# Patient Record
Sex: Female | Born: 1990
Health system: Southern US, Community
[De-identification: ages and names within clinical notes are randomized; demographics above are authoritative.]

## PROBLEM LIST (undated history)

## (undated) ENCOUNTER — Ambulatory Visit: Admission: EM | Payer: Self-pay

## (undated) ENCOUNTER — Inpatient Hospital Stay (HOSPITAL_COMMUNITY): Payer: Self-pay

## (undated) DIAGNOSIS — D649 Anemia, unspecified: Secondary | ICD-10-CM

## (undated) DIAGNOSIS — N939 Abnormal uterine and vaginal bleeding, unspecified: Secondary | ICD-10-CM

## (undated) DIAGNOSIS — E282 Polycystic ovarian syndrome: Secondary | ICD-10-CM

## (undated) DIAGNOSIS — D219 Benign neoplasm of connective and other soft tissue, unspecified: Secondary | ICD-10-CM

## (undated) HISTORY — DX: Polycystic ovarian syndrome: E28.2

---

## 2017-02-24 ENCOUNTER — Ambulatory Visit (HOSPITAL_COMMUNITY)
Admission: EM | Admit: 2017-02-24 | Discharge: 2017-02-24 | Disposition: A | Discharge status: Home / Self Care | Payer: Self-pay | Attending: Family Medicine | Admitting: Family Medicine

## 2017-02-24 ENCOUNTER — Encounter (HOSPITAL_COMMUNITY): Payer: Self-pay | Admitting: Emergency Medicine

## 2017-02-24 DIAGNOSIS — N946 Dysmenorrhea, unspecified: Secondary | ICD-10-CM

## 2017-02-24 DIAGNOSIS — N939 Abnormal uterine and vaginal bleeding, unspecified: Secondary | ICD-10-CM

## 2017-02-24 HISTORY — DX: Abnormal uterine and vaginal bleeding, unspecified: N93.9

## 2017-02-24 MED ORDER — KETOROLAC TROMETHAMINE 60 MG/2ML IM SOLN
INTRAMUSCULAR | Status: AC
  Filled 2017-02-24: qty 2

## 2017-02-24 MED ORDER — KETOROLAC TROMETHAMINE 60 MG/2ML IM SOLN
45.0000 mg | Freq: Once | INTRAMUSCULAR | Status: AC
  Administered 2017-02-24: 45 mg via INTRAMUSCULAR

## 2017-02-24 MED ORDER — NORGESTIM-ETH ESTRAD TRIPHASIC 0.18/0.215/0.25 MG-25 MCG PO TABS: 1.0000 | ORAL_TABLET | Freq: Every day | ORAL | 1 refills | Status: DC

## 2017-02-24 NOTE — ED Triage Notes (Signed)
Pt sts severe menstrual cramps today; pt sts bleeding x 2 days with hx of abnormal uterine bleeding

## 2017-02-24 NOTE — Discharge Instructions (Signed)
Take the meds as directed. Follow-up with the gynecologist as soon as possible.

## 2017-02-24 NOTE — ED Provider Notes (Signed)
Iatan    CSN: [id number] Arrival date & time: 02/24/17  1710     History   Chief Complaint Chief Complaint  Patient presents with  . Abdominal Cramping    HPI Lori Gillespie is a 26 y.o. female.   26 year old female complaining of dysmenorrhea. She has had some minor menstrual cramping previously that resolved with OTC NSAIDs but since chest today the menstrual cramping is been moderate to severe. Her menses started 2 days ago. She states as a little heavy. She has a history of a you be and irregular periods. She states a out of state physician gave her some pills as she recently started taking but she is unsure as to what that he has she is trying to call and find out. She does not have a PCP. Denies GI symptoms.  The medication prescribed by the previous physician. To be norgestimate and ethyl estradiol likely a [place]. This week she started the placebo pack, bus her menses and menstrual cramps returned.      Past Medical History:  Diagnosis Date  . Abnormal uterine bleeding     There are no active problems to display for this patient.   History reviewed. No pertinent surgical history.  OB History    No data available       Home Medications    Prior to Admission medications   Medication Sig Start Date End Date Taking? Authorizing Provider  Norgestimate-Ethinyl Estradiol Triphasic (ORTHO TRI-CYCLEN LO) 0.18/0.215/0.25 MG-25 MCG tab Take 1 tablet by mouth daily. 02/24/17   Janne Napoleon, NP    Family History History reviewed. No pertinent family history.  Social History Social History  Substance Use Topics  . Smoking status: Never Smoker  . Smokeless tobacco: Never Used  . Alcohol use No     Allergies   Patient has no known allergies.   Review of Systems Review of Systems  Constitutional: Negative.   Respiratory: Negative.   Gastrointestinal: Negative.   Genitourinary: Positive for menstrual problem. Negative for dysuria,  flank pain, frequency, genital sores and vaginal discharge.  Musculoskeletal: Negative.   Neurological: Negative.   All other systems reviewed and are negative.    Physical Exam Triage Vital Signs ED Triage Vitals [02/24/17 1727]  Enc Vitals Group     BP 138/84     Pulse Rate (!) 102     Resp 18     Temp 99.5 F (37.5 C)     Temp Source Oral     SpO2 100 %     Weight      Height      Head Circumference      Peak Flow      Pain Score 10     Pain Loc      Pain Edu?      Excl. in [place]?    No data found.   Updated Vital Signs BP 138/84 (BP Location: Right Arm)   Pulse (!) 102   Temp 99.5 F (37.5 C) (Oral)   Resp 18   SpO2 100%   Visual Acuity Right Eye Distance:   Left Eye Distance:   Bilateral Distance:    Right Eye Near:   Left Eye Near:    Bilateral Near:     Physical Exam  Constitutional: She is oriented to person, place, and time. She appears well-developed and well-nourished. No distress.  HENT:  Head: Normocephalic and atraumatic.  Eyes: EOM are normal.  Neck: Normal range of motion. Neck  supple.  Cardiovascular: Normal rate.   Pulmonary/Chest: Effort normal. No respiratory distress.  Musculoskeletal: She exhibits no edema.  Neurological: She is alert and oriented to person, place, and time. She exhibits normal muscle tone.  Skin: Skin is warm and dry.  Psychiatric: She has a normal mood and affect.  Nursing note and vitals reviewed.    UC Treatments / Results  Labs (all labs ordered are listed, but only abnormal results are displayed) Labs Reviewed - No data to display  EKG  EKG Interpretation None       Radiology No results found.  Procedures Procedures (including critical care time)  Medications Ordered in UC Medications  ketorolac (TORADOL) injection 45 mg (not administered)     Initial Impression / Assessment and Plan / UC Course  I have reviewed the triage vital signs and the nursing notes.  Pertinent labs & imaging  results that were available during my care of the patient were reviewed by me and considered in my medical decision making (see chart for details).    Take the meds as directed. Follow-up with the gynecologist as soon as possible.     Final Clinical Impressions(s) / UC Diagnoses   Final diagnoses:  Dysmenorrhea  Abnormal uterine bleeding (AUB)    New Prescriptions New Prescriptions   NORGESTIMATE-ETHINYL ESTRADIOL TRIPHASIC (ORTHO TRI-CYCLEN LO) 0.18/0.215/0.25 MG-25 MCG TAB    Take 1 tablet by mouth daily.     Controlled Substance Prescriptions [place] Controlled Substance Registry consulted? Not Applicable   Janne Napoleon, NP 02/24/17 1806

## 2017-03-05 ENCOUNTER — Encounter (HOSPITAL_BASED_OUTPATIENT_CLINIC_OR_DEPARTMENT_OTHER): Payer: Self-pay

## 2017-03-05 ENCOUNTER — Emergency Department (HOSPITAL_BASED_OUTPATIENT_CLINIC_OR_DEPARTMENT_OTHER): Payer: Self-pay

## 2017-03-05 ENCOUNTER — Inpatient Hospital Stay (HOSPITAL_BASED_OUTPATIENT_CLINIC_OR_DEPARTMENT_OTHER)
Admission: AD | Admit: 2017-03-05 | Discharge: 2017-03-06 | DRG: 812 | Disposition: A | Discharge status: Home / Self Care | Payer: Self-pay | Attending: Obstetrics & Gynecology | Admitting: Obstetrics & Gynecology

## 2017-03-05 ENCOUNTER — Inpatient Hospital Stay (HOSPITAL_COMMUNITY): Payer: Self-pay

## 2017-03-05 DIAGNOSIS — N921 Excessive and frequent menstruation with irregular cycle: Secondary | ICD-10-CM | POA: Diagnosis present

## 2017-03-05 DIAGNOSIS — D649 Anemia, unspecified: Secondary | ICD-10-CM

## 2017-03-05 DIAGNOSIS — D259 Leiomyoma of uterus, unspecified: Secondary | ICD-10-CM | POA: Diagnosis present

## 2017-03-05 DIAGNOSIS — Z8742 Personal history of other diseases of the female genital tract: Secondary | ICD-10-CM

## 2017-03-05 DIAGNOSIS — N939 Abnormal uterine and vaginal bleeding, unspecified: Secondary | ICD-10-CM | POA: Diagnosis present

## 2017-03-05 DIAGNOSIS — D500 Iron deficiency anemia secondary to blood loss (chronic): Principal | ICD-10-CM | POA: Diagnosis present

## 2017-03-05 HISTORY — DX: Anemia, unspecified: D64.9

## 2017-03-05 HISTORY — DX: Benign neoplasm of connective and other soft tissue, unspecified: D21.9

## 2017-03-05 LAB — CBC WITH DIFFERENTIAL/PLATELET
BASOS PCT: 0 %
Basophils Absolute: 0 10*3/uL (ref 0.0–0.1)
EOS PCT: 2 %
Eosinophils Absolute: 0.3 10*3/uL (ref 0.0–0.7)
HEMATOCRIT: 17.6 % — AB (ref 36.0–46.0)
HEMOGLOBIN: 4.9 g/dL — AB (ref 12.0–15.0)
LYMPHS PCT: 17 %
Lymphs Abs: 2.2 10*3/uL (ref 0.7–4.0)
MCH: 22.4 pg — AB (ref 26.0–34.0)
MCHC: 27.8 g/dL — ABNORMAL LOW (ref 30.0–36.0)
MCV: 80.4 fL (ref 78.0–100.0)
MONOS PCT: 8 %
Monocytes Absolute: 1 10*3/uL (ref 0.1–1.0)
NEUTROS ABS: 9.6 10*3/uL — AB (ref 1.7–7.7)
NEUTROS PCT: 73 %
PLATELETS: 450 10*3/uL — AB (ref 150–400)
RBC: 2.19 MIL/uL — ABNORMAL LOW (ref 3.87–5.11)
RDW: 23.8 % — ABNORMAL HIGH (ref 11.5–15.5)
WBC: 13.1 10*3/uL — ABNORMAL HIGH (ref 4.0–10.5)

## 2017-03-05 LAB — HCG, QUANTITATIVE, PREGNANCY

## 2017-03-05 LAB — COMPREHENSIVE METABOLIC PANEL
ALBUMIN: 3.3 g/dL — AB (ref 3.5–5.0)
ALK PHOS: 52 U/L (ref 38–126)
ALT: 17 U/L (ref 14–54)
ANION GAP: 8 (ref 5–15)
AST: 22 U/L (ref 15–41)
BILIRUBIN TOTAL: 0.3 mg/dL (ref 0.3–1.2)
BUN: 11 mg/dL (ref 6–20)
CO2: 22 mmol/L (ref 22–32)
Calcium: 8.4 mg/dL — ABNORMAL LOW (ref 8.9–10.3)
Chloride: 107 mmol/L (ref 101–111)
Creatinine, Ser: 0.57 mg/dL (ref 0.44–1.00)
GFR calc Af Amer: >60 mL/min (ref >60)
GFR calc non Af Amer: >60 mL/min (ref >60)
GLUCOSE: 112 mg/dL — AB (ref 65–99)
POTASSIUM: 3.4 mmol/L — AB (ref 3.5–5.1)
SODIUM: 137 mmol/L (ref 135–145)
Total Protein: 6.4 g/dL — ABNORMAL LOW (ref 6.5–8.1)

## 2017-03-05 LAB — URINALYSIS, ROUTINE W REFLEX MICROSCOPIC
BILIRUBIN URINE: NEGATIVE
Glucose, UA: NEGATIVE mg/dL
Ketones, ur: NEGATIVE mg/dL
NITRITE: NEGATIVE
Protein, ur: NEGATIVE mg/dL
SPECIFIC GRAVITY, URINE: 1.01 (ref 1.005–1.030)
pH: 6 (ref 5.0–8.0)

## 2017-03-05 LAB — PROTIME-INR
INR: 0.95
Prothrombin Time: 12.6 seconds (ref 11.4–15.2)

## 2017-03-05 LAB — TROPONIN I: Troponin I: <0.03 ng/mL (ref <0.03)

## 2017-03-05 LAB — URINALYSIS, MICROSCOPIC (REFLEX)

## 2017-03-05 LAB — PREPARE RBC (CROSSMATCH)

## 2017-03-05 LAB — ABO/RH: ABO/RH(D): O POS

## 2017-03-05 MED ORDER — ESTROGENS CONJUGATED 25 MG IJ SOLR
25.0000 mg | Freq: Once | INTRAMUSCULAR | Status: DC
  Filled 2017-03-05: qty 25

## 2017-03-05 MED ORDER — SODIUM CHLORIDE 0.9 % IV SOLN
Freq: Once | INTRAVENOUS | Status: AC
  Administered 2017-03-05: 13:00:00 via INTRAVENOUS

## 2017-03-05 MED ORDER — SODIUM CHLORIDE 0.9 % IV SOLN: Freq: Once | INTRAVENOUS | Status: DC

## 2017-03-05 MED ORDER — DIPHENHYDRAMINE HCL 25 MG PO CAPS
25.0000 mg | ORAL_CAPSULE | Freq: Once | ORAL | Status: AC
  Administered 2017-03-05: 25 mg via ORAL
  Filled 2017-03-05: qty 1

## 2017-03-05 MED ORDER — ACETAMINOPHEN 325 MG PO TABS
650.0000 mg | ORAL_TABLET | Freq: Once | ORAL | Status: AC
  Administered 2017-03-05: 650 mg via ORAL
  Filled 2017-03-05: qty 2

## 2017-03-05 MED ORDER — PENTAFLUOROPROP-TETRAFLUOROETH EX AERO
INHALATION_SPRAY | CUTANEOUS | Status: DC | PRN
  Filled 2017-03-05: qty 30

## 2017-03-05 MED ORDER — PRENATAL MULTIVITAMIN CH
1.0000 | ORAL_TABLET | Freq: Every day | ORAL | Status: DC
  Filled 2017-03-05: qty 1

## 2017-03-05 MED ORDER — PENTAFLUOROPROP-TETRAFLUOROETH EX AERO
INHALATION_SPRAY | CUTANEOUS | Status: AC
  Filled 2017-03-05: qty 30

## 2017-03-05 NOTE — ED Triage Notes (Signed)
C/o CP x 1 week-pt states she had recent heavy vaginal bleeding-started meds x 1 month ago-pt is pale-DOE noted

## 2017-03-05 NOTE — ED Notes (Signed)
ED Provider at bedside. 

## 2017-03-05 NOTE — ED Notes (Signed)
Lab draw attempted unsuccessful, another RN to attempt when pt returns from xray.

## 2017-03-05 NOTE — MAU Provider Note (Signed)
History  I confirm that I have verified the information documented in the medical student's note and that I have also personally reperformed the physical exam and all medical decision making activities. Laury Gillespie, CNM  03/05/2017 8:43 PM     CSN: [id number]  Arrival date & time 03/05/17  1119   First Provider Initiated Contact with Patient 03/05/17 1629      Chief Complaint  Patient presents with  . Chest Pain  . Vaginal Bleeding    Pt is a 26 yo female with a PMH of Abnormal Uterine Bleeding due to fibroids who presents with vaginal bleeding. Pt was transported from ED in [facility] due to low Hemoglobin and hematocrit. Pt presented to the ED with spotting, chest palpitations, SOB and dizziness that started a week ago. Pt reports that she was diagnosed with Aub while she lived in [place]. She had one episode of bleeding that required a prescription for ferrous sulfate. Seven months ago, she move to [facility] and started exercising more. She started having episodes of heavy bleeding about 6 wks ago so she saw a doctor in [facility] who prescribed her a combination OCP (Norgestimate and Ethinyl Estradiol) to regular her cycle. During the last wk of her cycle, she started to bleeding heavily with severe abd cramps. She was again by her doctor and started on a new combined OCP at the end of Oct. Since then, she has had intermittent spotting described as bright red with clots. [place] then went to [place] because of the associated symptoms such as the chest palpitations, SOB and dizziness.     Past Medical History:  Diagnosis Date  . Abnormal uterine bleeding   . Abnormal uterine bleeding   . Anemia   . Fibroids     History reviewed. No pertinent surgical history.  No family history on file.  Social History   Tobacco Use  . Smoking status: Never Smoker  . Smokeless tobacco: Never Used  Substance Use Topics  . Alcohol use: No  . Drug use: No    OB History    No data  available      Review of Systems  Constitutional: Positive for activity change and fatigue. Negative for appetite change and fever.  HENT: Negative for congestion.   Eyes: Negative for visual disturbance.  Respiratory: Positive for shortness of breath. Negative for cough, chest tightness and wheezing.   Cardiovascular: Positive for palpitations. Negative for chest pain and leg swelling.  Gastrointestinal: Negative for abdominal pain and nausea.  Genitourinary: Positive for menstrual problem and vaginal bleeding. Negative for difficulty urinating, flank pain, hematuria and vaginal pain.  Musculoskeletal: Negative for back pain and myalgias.  Skin: Negative for color change and rash.  Neurological: Positive for dizziness. Negative for syncope and headaches.  Psychiatric/Behavioral: Negative for agitation and confusion.    Allergies  Patient has no known allergies.  Home Medications    BP 123/62   Pulse 89   Temp 98 F (36.7 C) (Oral)   Resp 16   Ht 5\' 1"  (1.549 m)   Wt 90.8 kg (200 lb 2.8 oz)   LMP 02/19/2017   SpO2 100%   BMI 37.82 kg/m   Physical Exam  Constitutional: She is oriented to person, place, and time. She appears well-developed and well-nourished. No distress.  HENT:  Head: Normocephalic and atraumatic.  Eyes: Conjunctivae are normal. No scleral icterus.  Neck: Normal range of motion. Neck supple.  Cardiovascular: Normal rate, regular  rhythm, normal heart sounds and intact distal pulses. Exam reveals no gallop and no friction rub.  No murmur heard. Pulmonary/Chest: Effort normal and breath sounds normal. No respiratory distress. She has no wheezes. She exhibits no tenderness.  Abdominal: Soft. Bowel sounds are normal. She exhibits no distension. There is no tenderness.  Musculoskeletal: Normal range of motion.  Neurological: She is alert and oriented to person, place, and time.  Skin: Skin is warm and dry. No rash noted.  Psychiatric: She has a normal mood  and affect.    MAU Course  Procedures (including critical care time) Results for orders placed or performed during the hospital encounter of 03/05/17 (from the past 24 hour(s))  Type and screen [place]     Status: None (Preliminary result)   Collection Time: 03/05/17 12:05 PM  Result Value Ref Range   ABO/RH(D) O POS    Antibody Screen PENDING    Sample Expiration 03/08/2017    Unit Number [id number]    Blood Component Type RED CELLS,LR    Unit division 00    Status of Unit ISSUED    Unit tag comment VERBAL ORDERS PER DR PFEIFFER    Transfusion Status OK TO TRANSFUSE    Crossmatch Result PENDING    Unit Number [id number]    Blood Component Type RED CELLS,LR    Unit division 00    Status of Unit ISSUED    Unit tag comment VERBAL ORDERS PER DR PFEIFFER    Transfusion Status OK TO TRANSFUSE    Crossmatch Result PENDING   Comprehensive metabolic panel     Status: Abnormal   Collection Time: 03/05/17 12:39 PM  Result Value Ref Range   Sodium 137 135 - 145 mmol/L   Potassium 3.4 (L) 3.5 - 5.1 mmol/L   Chloride 107 101 - 111 mmol/L   CO2 22 22 - 32 mmol/L   Glucose, Bld 112 (H) 65 - 99 mg/dL   BUN 11 6 - 20 mg/dL   Creatinine, Ser 0.57 0.44 - 1.00 mg/dL   Calcium 8.4 (L) 8.9 - 10.3 mg/dL   Total Protein 6.4 (L) 6.5 - 8.1 g/dL   Albumin 3.3 (L) 3.5 - 5.0 g/dL   AST 22 15 - 41 U/L   ALT 17 14 - 54 U/L   Alkaline Phosphatase 52 38 - 126 U/L   Total Bilirubin 0.3 0.3 - 1.2 mg/dL   GFR calc non Af Amer >60 >60 mL/min   GFR calc Af Amer >60 >60 mL/min   Anion gap 8 5 - 15  Troponin I     Status: None   Collection Time: 03/05/17 12:39 PM  Result Value Ref Range   Troponin I <0.03 <0.03 ng/mL  CBC with Differential     Status: Abnormal   Collection Time: 03/05/17 12:39 PM  Result Value Ref Range   WBC 13.1 (H) 4.0 - 10.5 K/uL   RBC 2.19 (L) 3.87 - 5.11 MIL/uL   Hemoglobin 4.9 (LL) 12.0 - 15.0 g/dL   HCT 17.6 (L) 36.0 - 46.0 %   MCV 80.4 78.0 -  100.0 fL   MCH 22.4 (L) 26.0 - 34.0 pg   MCHC 27.8 (L) 30.0 - 36.0 g/dL   RDW 23.8 (H) 11.5 - 15.5 %   Platelets 450 (H) 150 - 400 K/uL   Neutrophils Relative % 73 %   Lymphocytes Relative 17 %   Monocytes Relative 8 %   Eosinophils Relative 2 %   Basophils  Relative 0 %   Neutro Abs 9.6 (H) 1.7 - 7.7 K/uL   Lymphs Abs 2.2 0.7 - 4.0 K/uL   Monocytes Absolute 1.0 0.1 - 1.0 K/uL   Eosinophils Absolute 0.3 0.0 - 0.7 K/uL   Basophils Absolute 0.0 0.0 - 0.1 K/uL   RBC Morphology RARE NRBCs    WBC Morphology MILD LEFT SHIFT (1-5% METAS, OCC MYELO, OCC BANDS)   Protime-INR     Status: None   Collection Time: 03/05/17 12:39 PM  Result Value Ref Range   Prothrombin Time 12.6 11.4 - 15.2 seconds   INR 0.95   hCG, quantitative, pregnancy     Status: None   Collection Time: 03/05/17 12:40 PM  Result Value Ref Range   hCG, Beta Chain, Quant, S <1 <5 mIU/mL  Urinalysis, Routine w reflex microscopic     Status: Abnormal   Collection Time: 03/05/17  1:45 PM  Result Value Ref Range   Color, Urine YELLOW YELLOW   APPearance CLOUDY (A) CLEAR   Specific Gravity, Urine 1.010 1.005 - 1.030   pH 6.0 5.0 - 8.0   Glucose, UA NEGATIVE NEGATIVE mg/dL   Hgb urine dipstick LARGE (A) NEGATIVE   Bilirubin Urine NEGATIVE NEGATIVE   Ketones, ur NEGATIVE NEGATIVE mg/dL   Protein, ur NEGATIVE NEGATIVE mg/dL   Nitrite NEGATIVE NEGATIVE   Leukocytes, UA SMALL (A) NEGATIVE  Urinalysis, Microscopic (reflex)     Status: Abnormal   Collection Time: 03/05/17  1:45 PM  Result Value Ref Range   RBC / HPF TOO NUMEROUS TO COUNT 0 - 5 RBC/hpf   WBC, UA 6-30 0 - 5 WBC/hpf   Bacteria, UA MANY (A) NONE SEEN   Squamous Epithelial / LPF 0-5 (A) NONE SEEN  Type and screen     Status: None (Preliminary result)   Collection Time: 03/05/17  5:36 PM  Result Value Ref Range   ABO/RH(D) O POS    Antibody Screen PENDING    Sample Expiration 03/08/2017    Unit Number [id number]    Blood Component Type RED CELLS,LR     Unit division 00    Status of Unit ALLOCATED    Transfusion Status PENDING    Crossmatch Result PENDING    Unit Number [id number]    Blood Component Type RED CELLS,LR    Unit division 00    Status of Unit ALLOCATED    Transfusion Status PENDING    Crossmatch Result PENDING    Unit Number [id number]    Blood Component Type RED CELLS,LR    Unit division 00    Status of Unit ALLOCATED    Transfusion Status PENDING    Crossmatch Result PENDING    Unit Number [id number]    Blood Component Type RED CELLS,LR    Unit division 00    Status of Unit ALLOCATED    Transfusion Status PENDING    Crossmatch Result PENDING      Labs Reviewed  COMPREHENSIVE METABOLIC PANEL - Abnormal; Notable for the following components:      Result Value   Potassium 3.4 (*)    Glucose, Bld 112 (*)    Calcium 8.4 (*)    Total Protein 6.4 (*)    Albumin 3.3 (*)    All other components within normal limits  CBC WITH DIFFERENTIAL/PLATELET - Abnormal; Notable for the following components:   WBC 13.1 (*)    RBC 2.19 (*)    Hemoglobin 4.9 (*)    HCT 17.6 (*)  MCH 22.4 (*)    MCHC 27.8 (*)    RDW 23.8 (*)    Platelets 450 (*)    Neutro Abs 9.6 (*)    All other components within normal limits  URINALYSIS, ROUTINE W REFLEX MICROSCOPIC - Abnormal; Notable for the following components:   APPearance CLOUDY (*)    Hgb urine dipstick LARGE (*)    Leukocytes, UA SMALL (*)    All other components within normal limits  URINALYSIS, MICROSCOPIC (REFLEX) - Abnormal; Notable for the following components:   Bacteria, UA MANY (*)    Squamous Epithelial / LPF 0-5 (*)    All other components within normal limits  TROPONIN I  PROTIME-INR  HCG, QUANTITATIVE, PREGNANCY  TYPE AND SCREEN   Dg Chest 2 View  Result Date: 03/05/2017 CLINICAL DATA:  Chest pain and dizziness for 1 week EXAM: CHEST  2 VIEW COMPARISON:  None. FINDINGS: The heart size and mediastinal contours are within normal limits. Both lungs are  clear. The visualized skeletal structures are unremarkable. IMPRESSION: No active cardiopulmonary disease. Electronically Signed   By: Inez Catalina M.D.   On: 03/05/2017 12:18     1. Symptomatic anemia   2. History of dysfunctional uterine bleeding       MDM  Pt was transported from [facility] for low Hgb (4.9), Hct (17.6) Pt was given fluids, pt looks stable, vitals are stable Dr. Elonda Husky was contacted at 1720 and he recommended the following: - Admit pt to GYN floor - 3-4 units of RBCs - routine transfusion orders - pelvic ultrasound - 25 mg of IV premarin  Assessment/Plan: Admit to GYN unit Routine GYN orders Dr. Elonda Husky assumes care of patient upon admission   Linwood Dibbles Medical Student 03/05/2017 4:49 PM

## 2017-03-05 NOTE — MAU Note (Signed)
Pt up to BR stated she started having more vag bleeding again.

## 2017-03-05 NOTE — ED Notes (Signed)
Consent obtained for emergency blood release.

## 2017-03-05 NOTE — ED Notes (Signed)
Patient transported to X-ray 

## 2017-03-05 NOTE — ED Provider Notes (Signed)
[facility] [facility] Provider Note   CSN: [id number] Arrival date & time: 03/05/17  1119     History   Chief Complaint Chief Complaint  Patient presents with  . Chest Pain  . Vaginal Bleeding    HPI Lori Gillespie is a 26 y.o. female.  HPI Patient has been having extremely heavy vaginal bleeding.  She reports over this past month she has been having episodes of bleeding such that there is pools of blood on the floor.  She did have prior history of irregular menses and irregular amounts of bleeding with known fibroids.  Patient was seen at [facility] emergency department [phone], at that time hemoglobin was 11.6. She was prescribed  NORGESTIM-ETH ESTRAD TRIPHASIC.  She was subsequently seen at urgent care on 10\31 with cramping and bleeding.  Prescribed Ortho Tri-Cyclen.  Patient presents to our emergency department with shortness of breath and chest pain.  This is been going on for the past week.  Patient reports with any exertion she is getting very short of breath and lightheaded.  She reports that her vaginal bleeding has decreased substantially and now she is getting light bleeding and spotting.    Past Medical History:  Diagnosis Date  . Abnormal uterine bleeding   . Abnormal uterine bleeding   . Anemia   . Fibroids     Patient Active Problem List   Diagnosis Date Noted  . Symptomatic anemia   . Abnormal uterine bleeding (AUB) 03/05/2017    History reviewed. No pertinent surgical history.  OB History    No data available       Home Medications    Prior to Admission medications   Medication Sig Start Date End Date Taking? Authorizing Provider  Cyanocobalamin (VITAMIN B 12 PO) Take 1 tablet daily by mouth.   Yes [provider]  naproxen sodium (ALEVE) 220 MG tablet Take 440 mg 2 (two) times daily as needed by mouth.   Yes [provider]  megestrol (MEGACE) 40 MG tablet Take 3 tablets at the same time daily for 5 days, then 2  tablets for 5 days, then 1 tablet daily 03/06/17   Florian Buff, MD    Family History No family history on file.  Social History Social History   Tobacco Use  . Smoking status: Never Smoker  . Smokeless tobacco: Never Used  Substance Use Topics  . Alcohol use: No  . Drug use: No     Allergies   Patient has no known allergies.   Review of Systems Review of Systems 10 Systems reviewed and are negative for acute change except as noted in the HPI.   Physical Exam Updated Vital Signs BP 114/69 (BP Location: Left Arm)   Pulse 84   Temp 98.4 F (36.9 C) (Oral)   Resp 18   Ht 5\' 1"  (1.549 m)   Wt 90.8 kg (200 lb 2.8 oz)   LMP 02/19/2017   SpO2 99%   BMI 37.82 kg/m   Physical Exam  Constitutional: She is oriented to person, place, and time.  Patient is alert and nontoxic.  She does not have respiratory distress at rest.  Patient is very pale in appearance.  She is well-nourished and well-developed.  HENT:  Head: Normocephalic and atraumatic.  Mouth/Throat: Oropharynx is clear and moist.  Eyes: EOM are normal.  Neck: Neck supple.  Cardiovascular:  Borderline tachycardia.  No rub murmur gallop.  Pulmonary/Chest: Effort normal and breath sounds normal.  Abdominal: Soft.  She exhibits no distension.  Mild diffuse lower abdominal discomfort to palpation.  No guarding.  Genitourinary:  Genitourinary Comments: Speculum exam, small to moderate pool of thin dark red blood in the vaginal vault.  No active bleeding from the cervix.  No clot within the cervix.  Musculoskeletal: Normal range of motion. She exhibits no edema or tenderness.  Neurological: She is alert and oriented to person, place, and time. No cranial nerve deficit. She exhibits normal muscle tone. Coordination normal.  Skin: Skin is warm and dry. There is pallor.  Psychiatric: She has a normal mood and affect.     ED Treatments / Results  Labs (all labs ordered are listed, but only abnormal results are  displayed) Labs Reviewed  COMPREHENSIVE METABOLIC PANEL - Abnormal; Notable for the following components:      Result Value   Potassium 3.4 (*)    Glucose, Bld 112 (*)    Calcium 8.4 (*)    Total Protein 6.4 (*)    Albumin 3.3 (*)    All other components within normal limits  CBC WITH DIFFERENTIAL/PLATELET - Abnormal; Notable for the following components:   WBC 13.1 (*)    RBC 2.19 (*)    Hemoglobin 4.9 (*)    HCT 17.6 (*)    MCH 22.4 (*)    MCHC 27.8 (*)    RDW 23.8 (*)    Platelets 450 (*)    Neutro Abs 9.6 (*)    All other components within normal limits  URINALYSIS, ROUTINE W REFLEX MICROSCOPIC - Abnormal; Notable for the following components:   APPearance CLOUDY (*)    Hgb urine dipstick LARGE (*)    Leukocytes, UA SMALL (*)    All other components within normal limits  URINALYSIS, MICROSCOPIC (REFLEX) - Abnormal; Notable for the following components:   Bacteria, UA MANY (*)    Squamous Epithelial / LPF 0-5 (*)    All other components within normal limits  CBC - Abnormal; Notable for the following components:   WBC 13.5 (*)    RBC 3.83 (*)    Hemoglobin 9.8 (*)    HCT 29.8 (*)    MCV 77.8 (*)    MCH 25.6 (*)    RDW 18.9 (*)    All other components within normal limits  TROPONIN I  PROTIME-INR  HCG, QUANTITATIVE, PREGNANCY  TYPE AND SCREEN  TYPE AND SCREEN  PREPARE RBC (CROSSMATCH)  ABO/RH    EKG  EKG Interpretation  Date/Time:  Friday March 05 2017 11:24:36 EST Ventricular Rate:  104 PR Interval:  124 QRS Duration: 90 QT Interval:  334 QTC Calculation: 439 R Axis:   79 Text Interpretation:  Sinus tachycardia Nonspecific T wave abnormality Abnormal ECG agree. no old comparison Confirmed by Charlesetta Shanks [phone]) on 03/05/2017 2:42:36 PM       Radiology Dg Chest 2 View  Result Date: 03/05/2017 CLINICAL DATA:  Chest pain and dizziness for 1 week EXAM: CHEST  2 VIEW COMPARISON:  None. FINDINGS: The heart size and mediastinal contours are within  normal limits. Both lungs are clear. The visualized skeletal structures are unremarkable. IMPRESSION: No active cardiopulmonary disease. Electronically Signed   By: Inez Catalina M.D.   On: 03/05/2017 12:18   US Pelvis (transabdominal Only)  Result Date: 03/05/2017 CLINICAL DATA:  Abnormal uterine bleeding.  Decreasing hemoglobin. EXAM: TRANSABDOMINAL ULTRASOUND OF PELVIS TECHNIQUE: Transabdominal ultrasound examination of the pelvis was performed including evaluation of the uterus, ovaries, adnexal regions, and pelvic cul-de-sac. COMPARISON:  None. FINDINGS: Uterus Measurements: 10.6 x 5.0 x 7.2 cm. Diffusely heterogeneous myometrium with numerous myometrial masses. The largest 3 myometrial masses were measured and are as follows. Anterior left uterine fundus subserosal myometrial mass measures 2.5 x 2.2 x 2.3 cm. Intramural posterior fundal mass measures 1.9 x 1.5 x 1.8 cm. Submucosal right fundal mass measures 2.2 x 1.8 x 2.4 cm. Endometrium Thickness: Not able to measure due to multiple myometrial masses distorting the endometrium. No focal abnormality visualized. Right ovary Measurements: 4.0 x 2.0 x 2.3 cm. Normal appearance/no adnexal mass. Left ovary Not seen. Other findings:  No abnormal free fluid. IMPRESSION: Large heterogeneous in echogenicity uterus with numerous myometrial masses, likely representing fibroids. Normal right ovary. Nonvisualization of the left ovary. Electronically Signed   By: Fidela Salisbury M.D.   On: 03/05/2017 18:29    Procedures Procedures (including critical care time)  Medications Ordered in ED Medications  conjugated estrogens (PREMARIN) injection 25 mg (not administered)  0.9 %  sodium chloride infusion (not administered)  prenatal multivitamin tablet 1 tablet (not administered)  megestrol (MEGACE) tablet 120 mg (120 mg Oral Given 03/06/17 0806)  0.9 %  sodium chloride infusion ( Intravenous Stopped 03/05/17 1330)  acetaminophen (TYLENOL) tablet 650 mg (650  mg Oral Given 03/05/17 2040)  diphenhydrAMINE (BENADRYL) capsule 25 mg (25 mg Oral Given 03/05/17 2040)  ferumoxytol (FERAHEME) 510 mg in sodium chloride 0.9 % 100 mL IVPB (0 mg Intravenous Stopped 03/06/17 0820)     Initial Impression / Assessment and Plan / ED Course  I have reviewed the triage vital signs and the nursing notes.  Pertinent labs & imaging results that were available during my care of the patient were reviewed by me and considered in my medical decision making (see chart for details).    Consult: (14: 00) discussed with Dr. Gala Romney for OB/GYN.  Advises accepts for admission to [facility].  Transfer to [facility] emergency department.   Final Clinical Impressions(s) / ED Diagnoses   Final diagnoses:  Symptomatic anemia  History of dysfunctional uterine bleeding  Abnormal uterine bleeding (AUB)  Patient had significant anemia at 4.9 symptomatic with chest pain, palpitations and dyspnea.  Fortunately, upon presentation her mental status was clear and she was not in acute respiratory distress.  EKG did not show acute ischemia.  Patient however had clearly had significant blood loss with her prior baseline identified greater than 11 mg/dL  Hg in early October.  Her heart rate would go from low 100s up to 120s with only the exertion required to reposition herself in the stretcher to sit up or  for positioning for pelvic exam.  I did feel patient was at significant risk of hypovolemic shock.  She was transfused 2 units O- blood,which is the only type available at the [facility] facility.  She was subsequently transferred with stable heart rate at rest, clear mental status and no respiratory distress to [facility].  ED Discharge Orders        Ordered    megestrol (MEGACE) 40 MG tablet     03/06/17 0727    Increase activity slowly     03/06/17 0727    Diet - low sodium heart healthy     03/06/17 0727    Call MD for:     03/06/17 7416       Charlesetta Shanks,  MD 03/06/17 1643

## 2017-03-05 NOTE — MAU Note (Signed)
Pt arrives via [facility] form [facility]. Pt presented there this am with vaginal bleeding for 2 weeks.

## 2017-03-05 NOTE — ED Notes (Signed)
Attempted to start 2nd IV to LFA and to draw blood. Unsuccessful, tol well. ED MD informed

## 2017-03-05 NOTE — ED Notes (Signed)
Pt laying flat for 10 mins in preparation for orthostatic vitals.

## 2017-03-05 NOTE — ED Notes (Signed)
[facility] at bedside 

## 2017-03-06 DIAGNOSIS — D649 Anemia, unspecified: Secondary | ICD-10-CM

## 2017-03-06 DIAGNOSIS — N939 Abnormal uterine and vaginal bleeding, unspecified: Secondary | ICD-10-CM

## 2017-03-06 LAB — CBC
HEMATOCRIT: 29.8 % — AB (ref 36.0–46.0)
HEMOGLOBIN: 9.8 g/dL — AB (ref 12.0–15.0)
MCH: 25.6 pg — AB (ref 26.0–34.0)
MCHC: 32.9 g/dL (ref 30.0–36.0)
MCV: 77.8 fL — AB (ref 78.0–100.0)
Platelets: 372 10*3/uL (ref 150–400)
RBC: 3.83 MIL/uL — ABNORMAL LOW (ref 3.87–5.11)
RDW: 18.9 % — AB (ref 11.5–15.5)
WBC: 13.5 10*3/uL — ABNORMAL HIGH (ref 4.0–10.5)

## 2017-03-06 LAB — TYPE AND SCREEN
ABO/RH(D): O POS
Antibody Screen: NEGATIVE
UNIT DIVISION: 0
UNIT DIVISION: 0

## 2017-03-06 LAB — BPAM RBC
BLOOD PRODUCT EXPIRATION DATE: 201811292359
Blood Product Expiration Date: 201811292359
ISSUE DATE / TIME: 201811091410
ISSUE DATE / TIME: 201811091410
Unit Type and Rh: 9500
Unit Type and Rh: 9500

## 2017-03-06 MED ORDER — MEGESTROL ACETATE 40 MG PO TABS
120.0000 mg | ORAL_TABLET | Freq: Every day | ORAL | Status: DC
  Administered 2017-03-06: 120 mg via ORAL
  Filled 2017-03-06 (×2): qty 3

## 2017-03-06 MED ORDER — SODIUM CHLORIDE 0.9 % IV SOLN
510.0000 mg | Freq: Once | INTRAVENOUS | Status: AC
  Administered 2017-03-06: 510 mg via INTRAVENOUS
  Filled 2017-03-06: qty 17

## 2017-03-06 MED ORDER — MEGESTROL ACETATE 40 MG PO TABS: ORAL_TABLET | ORAL | 3 refills | Status: DC

## 2017-03-06 NOTE — Progress Notes (Signed)
Discharged home ambulatory in stable condition.

## 2017-03-06 NOTE — Discharge Instructions (Signed)
Abnormal Uterine Bleeding Abnormal uterine bleeding can affect women at various stages in life, including teenagers, women in their reproductive years, pregnant women, and women who have reached menopause. Several kinds of uterine bleeding are considered abnormal, including:  Bleeding or spotting between periods.  Bleeding after sexual intercourse.  Bleeding that is heavier or more than normal.  Periods that last longer than usual.  Bleeding after menopause. Many cases of abnormal uterine bleeding are minor and simple to treat, while others are more serious. Any type of abnormal bleeding should be evaluated by your health care provider. Treatment will depend on the cause of the bleeding. Follow these instructions at home: Monitor your condition for any changes. The following actions may help to alleviate any discomfort you are experiencing:  Avoid the use of tampons and douches as directed by your health care provider.  Change your pads frequently. You should get regular pelvic exams and Pap tests. Keep all follow-up appointments for diagnostic tests as directed by your health care provider. Contact a health care provider if:  Your bleeding lasts more than 1 week.  You feel dizzy at times. Get help right away if:  You pass out.  You are changing pads every 15 to 30 minutes.  You have abdominal pain.  You have a fever.  You become sweaty or weak.  You are passing large blood clots from the vagina.  You start to feel nauseous and vomit. This information is not intended to replace advice given to you by your health care provider. Make sure you discuss any questions you have with your health care provider. Document Released: 04/13/2005 Document Revised: 09/25/2015 Document Reviewed: 11/10/2012 Elsevier Interactive Patient Education  2017 Elsevier Inc.  

## 2017-03-06 NOTE — Discharge Summary (Signed)
Physician Discharge Summary  Patient ID: Lori Gillespie MRN: [id number] DOB/AGE: 29-May-1990 26 y.o.  Admit date: 03/05/2017 Discharge date: 03/06/2017  Admission Diagnoses: Menometrorrhagia Anemia due to menometrorrhagia, symptomatic   Discharge Diagnoses:  Active Problems:   Abnormal uterine bleeding (AUB)   Symptomatic anemia   Discharged Condition: good  Hospital Course:  pt was admitted with Hemoglobin 4.9 and was transfused a total of 4 units of PRBC after bleeding for 1 month straight Her hemoglobin in the [facility] system 1 month ago was >11 Her bleeding was stopped with 1 dose of IV premarin and oral megestrol Sonogram revealed normal endometrium and 3 small myomas none of which is distorting or affecting the endometrium Her post transfusion hemoglobin was 9.6 She also received IV feraheme  She was discharged on megestrol taper and instructed to follow up at [facility] office of [facility] office in 3 weeks. She will call tomorrow to make there appointment  Consults:   Significant Diagnostic Studies: labs: sonogram  Treatments: as above  Discharge Exam: Blood pressure 108/61, pulse 88, temperature 98.8 F (37.1 C), temperature source Oral, resp. rate 16, height 5\' 1"  (1.549 m), weight 200 lb 2.8 oz (90.8 kg), last menstrual period 02/19/2017, SpO2 100 %. General appearance: alert, cooperative and no distress GI: soft, non-tender; bowel sounds normal; no masses,  no organomegaly  Disposition: 01-Home or Self Care  Discharge Instructions    Call MD for:   Complete by:  As directed    Excessive vaginal bleeding   Diet - low sodium heart healthy   Complete by:  As directed    Increase activity slowly   Complete by:  As directed      Allergies as of 03/06/2017   No Known Allergies     Medication List    STOP taking these medications   ferrous sulfate 325 (65 FE) MG tablet   Norgestimate-Ethinyl Estradiol Triphasic 0.18/0.215/0.25 MG-25 MCG tab Commonly  known as:  ORTHO TRI-CYCLEN LO     TAKE these medications   megestrol 40 MG tablet Commonly known as:  MEGACE Take 3 tablets at the same time daily for 5 days, then 2 tablets for 5 days, then 1 tablet daily   naproxen sodium 220 MG tablet Commonly known as:  ALEVE Take 440 mg 2 (two) times daily as needed by mouth.   VITAMIN B 12 PO Take 1 tablet daily by mouth.      Follow-up [facility] up in 3 week(s).   Why:  gyn appointment Contact information: [place] [address] [phone]          Signed: Florian Buff 03/06/2017, 7:30 AM

## 2017-03-06 NOTE — Progress Notes (Signed)
Discharge instructions given and patient verbalized understanding of all instructions. Written copy of AVS given to patient.

## 2017-03-06 NOTE — Progress Notes (Signed)
Patient received 2 units of PRBCs totaling 4 units. Pt tolerated well. No reactions. Premeds given. Pt stated she feels much better and glad she has her color back to her skin. Stated she was very pale with yellow tenting when she went to [place]. Stated the staff thought she had liver disease. VSS. No signs or symptoms of acute distress. Will continue to monitor.

## 2017-03-09 LAB — BPAM RBC
BLOOD PRODUCT EXPIRATION DATE: 201811212359
BLOOD PRODUCT EXPIRATION DATE: 201812062359
Blood Product Expiration Date: 201812062359
Blood Product Expiration Date: 201812062359
ISSUE DATE / TIME: 201811092042
ISSUE DATE / TIME: 201811092306
UNIT TYPE AND RH: 5100
UNIT TYPE AND RH: 5100
UNIT TYPE AND RH: 5100
UNIT TYPE AND RH: 5100

## 2017-03-09 LAB — TYPE AND SCREEN
ABO/RH(D): O POS
ANTIBODY SCREEN: NEGATIVE
UNIT DIVISION: 0
UNIT DIVISION: 0
UNIT DIVISION: 0
Unit division: 0

## 2017-03-11 NOTE — H&P (Signed)
Attested            Attestation signed by Florian Buff, MD at 03/07/2017 6:01 PM    Attestation of Attending Supervision of Advanced Practitioner (CNM/NP/PA): Evaluation and management procedures were performed by the Advanced Practitioner under my supervision and collaboration.  I have reviewed the Advanced Practitioner's note and chart, and I agree with the management and plan.     Jacelyn Grip MD  Attending Physician for the Center for [facility]  03/07/2017  6:01 PM             Expand All Collapse All            Expand widget buttonCollapse widget button     Hide copied text   Hover for detailscustomization button                                                                                                       History    I confirm that I have verified the information documented in the medical student's note and that I have also personally reperformed the physical exam and all medical decision making activities.   [name], CNM   03/05/2017 8:43 PM            CSN: [id number]     Arrival date & time 03/05/17  1119      First Provider Initiated Contact with Patient 03/05/17 1629              Chief Complaint    Patient presents with    .   Chest Pain    .   Vaginal Bleeding          Pt is a 26 yo female with a PMH of Abnormal Uterine Bleeding due to fibroids who presents with vaginal bleeding. Pt was transported from ED in [facility] due to low Hemoglobin and hematocrit. Pt presented to the ED with spotting, chest palpitations, SOB and dizziness that started a week ago. Pt reports that she was diagnosed with Aub while she lived in [place]. She had one episode of bleeding that required a prescription for ferrous sulfate. Seven months ago, she move to [facility] and started exercising more. She started  having episodes of heavy bleeding about 6 wks ago so she saw a doctor in [facility] who prescribed her a combination OCP (Norgestimate and Ethinyl Estradiol) to regular her cycle. During the last wk of her cycle, she started to bleeding heavily with severe abd cramps. She was again by her doctor and started on a new combined OCP at the end of Oct. Since then, she has had intermittent spotting described as bright red with clots. [place] then went to the [place] because of the associated symptoms such as the chest palpitations, SOB and dizziness.                 Past Medical History:    Diagnosis   Date    .   Abnormal uterine bleeding        .  Abnormal uterine bleeding        .   Anemia        .   Fibroids              History reviewed. No pertinent surgical history.     No family history on file.      Social History            Tobacco Use    .   Smoking status:   Never Smoker    .   Smokeless tobacco:   Never Used    Substance Use Topics    .   Alcohol use:   No    .   Drug use:   No              OB History          No data available                Review of Systems   Constitutional: Positive for activity change and fatigue. Negative for appetite change and fever.   HENT: Negative for congestion.    Eyes: Negative for visual disturbance.   Respiratory: Positive for shortness of breath. Negative for cough, chest tightness and wheezing.    Cardiovascular: Positive for palpitations. Negative for chest pain and leg swelling.   Gastrointestinal: Negative for abdominal pain and nausea.   Genitourinary: Positive for menstrual problem and vaginal bleeding. Negative for difficulty urinating, flank pain, hematuria and vaginal pain.   Musculoskeletal: Negative for back pain and myalgias.   Skin: Negative for color change and rash.   Neurological: Positive for dizziness. Negative  for syncope and headaches.   Psychiatric/Behavioral: Negative for agitation and confusion.          Allergies    Patient has no known allergies.      Home Medications          BP 123/62   Pulse 89   Temp 98 F (36.7 C) (Oral)   Resp 16   Ht 5\' 1"  (1.549 m)   Wt 90.8 kg (200 lb 2.8 oz)   LMP 02/19/2017   SpO2 100%   BMI 37.82 kg/m      Physical Exam   Constitutional: She is oriented to person, place, and time. She appears well-developed and well-nourished. No distress.   HENT:   Head: Normocephalic and atraumatic.   Eyes: Conjunctivae are normal. No scleral icterus.   Neck: Normal range of motion. Neck supple.   Cardiovascular: Normal rate, regular rhythm, normal heart sounds and intact distal pulses. Exam reveals no gallop and no friction rub.   No murmur heard.  Pulmonary/Chest: Effort normal and breath sounds normal. No respiratory distress. She has no wheezes. She exhibits no tenderness.   Abdominal: Soft. Bowel sounds are normal. She exhibits no distension. There is no tenderness.  Musculoskeletal: Normal range of motion.  Neurological: She is alert and oriented to person, place, and time.   Skin: Skin is warm and dry. No rash noted.  Psychiatric: She has a normal mood and affect.          MAU Course    Procedures (including critical care time)  Lab Results Last 24 Hours  Labs Reviewed    COMPREHENSIVE METABOLIC PANEL - Abnormal; Notable for the following components:        Result   Value        Potassium   3.4 (*)            Glucose, Bld   112 (*)            Calcium   8.4 (*)            Total Protein   6.4 (*)            Albumin   3.3 (*)            All other components within normal limits    CBC WITH DIFFERENTIAL/PLATELET - Abnormal; Notable for the following components:        WBC   13.1 (*)            RBC   2.19 (*)            Hemoglobin   4.9 (*)            HCT   17.6 (*)            MCH   22.4 (*)            MCHC   27.8 (*)            RDW   23.8 (*)            Platelets   450 (*)            Neutro Abs   9.6 (*)            All other components within normal limits    URINALYSIS, ROUTINE W REFLEX MICROSCOPIC - Abnormal; Notable for the following components:        APPearance   CLOUDY (*)            Hgb urine dipstick   LARGE (*)            Leukocytes, UA   SMALL (*)            All other components within normal limits    URINALYSIS, MICROSCOPIC (REFLEX) - Abnormal; Notable for the following components:        Bacteria, UA   MANY (*)            Squamous Epithelial / LPF   0-5 (*)            All other components within normal limits    TROPONIN I    PROTIME-INR    HCG, QUANTITATIVE, PREGNANCY    TYPE AND SCREEN         Imaging Results (Last 48 hours)                        1.   Symptomatic anemia     2.   History of dysfunctional uterine bleeding                  MDM    Pt was transported from [facility] for low Hgb (4.9), Hct (17.6)  Pt was given fluids, pt looks stable, vitals are stable  Dr. Elonda Husky was contacted at 1720 and he  recommended the following:  - Admit pt to GYN floor  - 3-4 units of RBCs - routine transfusion orders  - pelvic ultrasound  - 25 mg of IV premarin     Assessment/Plan:  Admit  to GYN unit  Routine GYN orders  Dr. Elonda Husky assumes care of patient upon admission      Linwood Dibbles  Medical Student  03/05/2017 4:49 PM                 Cosigned by: Florian Buff, MD at 03/07/2017  6:01 PM   Electronically signed by Lacinda Axon, Medical Student at 03/05/2017  6:48 PM Electronically signed by [name], CNM at 03/05/2017  9:52 PM Electronically signed by Florian Buff, MD at 03/07/2017  6:01 PM

## 2017-06-18 ENCOUNTER — Other Ambulatory Visit: Payer: Self-pay | Admitting: Obstetrics & Gynecology

## 2017-10-16 ENCOUNTER — Other Ambulatory Visit: Payer: Self-pay | Admitting: Obstetrics & Gynecology

## 2018-04-29 ENCOUNTER — Telehealth: Payer: Self-pay

## 2018-04-29 NOTE — Telephone Encounter (Signed)
Please advise 

## 2018-04-29 NOTE — Telephone Encounter (Signed)
Sorry not taking new patients except for special cases.  If she wants a hormone eval I might suggest she see GYN anyway

## 2018-04-29 NOTE — Telephone Encounter (Signed)
I am unable to take any new pt at this time

## 2018-04-29 NOTE — Telephone Encounter (Signed)
Copied from [place] [id number]. Topic: Appointment Scheduling - New Patient >> Apr 29, 2018  3:52 PM Alanda Slim E wrote: New patient would like to be approved/scheduled for your office. Provider: with either Dr. Charlett Blake or Dr. Etter Sjogren  Pt would like a female provider due to wanting to look into her hormones and feels more comfortable with a female provider

## 2018-05-01 NOTE — Telephone Encounter (Signed)
She should see GYN across the hall if she wants a hormonal evaluation and work up. I am willing to take her on but do not have any openings for a couple of months

## 2018-05-13 NOTE — Telephone Encounter (Signed)
Called patient left message for patient to call the office back  

## 2018-06-06 ENCOUNTER — Encounter: Payer: Self-pay | Admitting: Medical

## 2018-06-06 ENCOUNTER — Ambulatory Visit (INDEPENDENT_AMBULATORY_CARE_PROVIDER_SITE_OTHER): Payer: No Typology Code available for payment source | Admitting: Medical

## 2018-06-06 ENCOUNTER — Telehealth: Payer: Self-pay | Admitting: Medical

## 2018-06-06 VITALS — BP 130/80 | HR 78 | Temp 98.1°F | Resp 16 | Ht 60.5 in | Wt 203.8 lb

## 2018-06-06 DIAGNOSIS — Z0000 Encounter for general adult medical examination without abnormal findings: Secondary | ICD-10-CM

## 2018-06-06 DIAGNOSIS — Z113 Encounter for screening for infections with a predominantly sexual mode of transmission: Secondary | ICD-10-CM

## 2018-06-06 LAB — LIPID PANEL
CHOLESTEROL: 159 mg/dL (ref 0–200)
HDL: 46.2 mg/dL (ref >39.00)
LDL CALC: 92 mg/dL (ref 0–99)
NonHDL: 112.71
TRIGLYCERIDES: 105 mg/dL (ref 0.0–149.0)
Total CHOL/HDL Ratio: 3
VLDL: 21 mg/dL (ref 0.0–40.0)

## 2018-06-06 LAB — CBC WITH DIFFERENTIAL/PLATELET
Basophils Absolute: 0.1 10*3/uL (ref 0.0–0.1)
Basophils Relative: 0.9 % (ref 0.0–3.0)
EOS PCT: 2.9 % (ref 0.0–5.0)
Eosinophils Absolute: 0.2 10*3/uL (ref 0.0–0.7)
HCT: 40 % (ref 36.0–46.0)
HEMOGLOBIN: 12.5 g/dL (ref 12.0–15.0)
Lymphocytes Relative: 31.9 % (ref 12.0–46.0)
Lymphs Abs: 2.2 10*3/uL (ref 0.7–4.0)
MCHC: 31.3 g/dL (ref 30.0–36.0)
MCV: 77.1 fl — ABNORMAL LOW (ref 78.0–100.0)
MONO ABS: 0.4 10*3/uL (ref 0.1–1.0)
Monocytes Relative: 5.3 % (ref 3.0–12.0)
Neutro Abs: 4.1 10*3/uL (ref 1.4–7.7)
Neutrophils Relative %: 59 % (ref 43.0–77.0)
Platelets: 406 10*3/uL — ABNORMAL HIGH (ref 150.0–400.0)
RBC: 5.19 Mil/uL — AB (ref 3.87–5.11)
RDW: 16 % — ABNORMAL HIGH (ref 11.5–15.5)
WBC: 7 10*3/uL (ref 4.0–10.5)

## 2018-06-06 LAB — COMPREHENSIVE METABOLIC PANEL
ALBUMIN: 4.5 g/dL (ref 3.5–5.2)
ALK PHOS: 105 U/L (ref 39–117)
ALT: 23 U/L (ref 0–35)
AST: 17 U/L (ref 0–37)
BUN: 16 mg/dL (ref 6–23)
CO2: 22 mEq/L (ref 19–32)
Calcium: 9.8 mg/dL (ref 8.4–10.5)
Chloride: 108 mEq/L (ref 96–112)
Creatinine, Ser: 0.53 mg/dL (ref 0.40–1.20)
GFR: 137.1 mL/min (ref >60.00)
Glucose, Bld: 86 mg/dL (ref 70–99)
POTASSIUM: 4.3 meq/L (ref 3.5–5.1)
Sodium: 141 mEq/L (ref 135–145)
TOTAL PROTEIN: 7.6 g/dL (ref 6.0–8.3)
Total Bilirubin: 0.4 mg/dL (ref 0.2–1.2)

## 2018-06-06 LAB — POC URINALSYSI DIPSTICK (AUTOMATED)
BILIRUBIN UA: NEGATIVE
Blood, UA: NEGATIVE
Glucose, UA: NEGATIVE
Ketones, UA: NEGATIVE
Leukocytes, UA: NEGATIVE
Nitrite, UA: NEGATIVE
Protein, UA: NEGATIVE
Urobilinogen, UA: NEGATIVE E.U./dL — AB
pH, UA: 6 (ref 5.0–8.0)

## 2018-06-06 NOTE — Progress Notes (Signed)
Subjective:    Patient ID: Lori Gillespie, female    DOB: Jul 30, 1990, 28 y.o.   MRN: [id number]  HPI  Pt in for first time.  Pt states she has history of pcos and will her gynecologist next Tuesday. Pt states she is trying to get pregnant. Pt states in past had severe anemia related to heavy bleeding. Pt also has uterine fibroids.  Pt also wants cpe today if possible.  Pt does exercise 5-6 times a week. Pt is eating healthier. No alcohol. Nonsmoker.   Review of Systems  Constitutional: Negative for chills, fatigue and fever.  HENT: Negative for congestion and drooling.   Respiratory: Negative for apnea, cough, chest tightness, shortness of breath and wheezing.   Cardiovascular: Negative for chest pain and palpitations.  Gastrointestinal: Negative for abdominal distention, abdominal pain, constipation, nausea and vomiting.  Genitourinary: Negative for decreased urine volume, difficulty urinating, dysuria, flank pain, frequency, genital sores, menstrual problem and urgency.  Musculoskeletal: Negative for arthralgias, back pain, joint swelling and neck stiffness.  Skin: Negative for color change and rash.  Neurological: Negative for dizziness, seizures, syncope, weakness and headaches.  Hematological: Negative for adenopathy. Does not bruise/bleed easily.  Psychiatric/Behavioral: Negative for behavioral problems, confusion and sleep disturbance. The patient is not hyperactive.     Past Medical History:  Diagnosis Date  . Abnormal uterine bleeding   . Abnormal uterine bleeding   . Anemia   . Fibroids      Social History   Socioeconomic History  . Marital status: Married    Spouse name: Not on file  . Number of children: Not on file  . Years of education: Not on file  . Highest education level: Not on file  Occupational History  . Not on file  Social Needs  . Financial resource strain: Not on file  . Food insecurity:    Worry: Not on file    Inability: Not on file    . Transportation needs:    Medical: Not on file    Non-medical: Not on file  Tobacco Use  . Smoking status: Never Smoker  . Smokeless tobacco: Never Used  Substance and Sexual Activity  . Alcohol use: No  . Drug use: No  . Sexual activity: Not on file  Lifestyle  . Physical activity:    Days per week: Not on file    Minutes per session: Not on file  . Stress: Not on file  Relationships  . Social connections:    Talks on phone: Not on file    Gets together: Not on file    Attends religious service: Not on file    Active member of club or organization: Not on file    Attends meetings of clubs or organizations: Not on file    Relationship status: Not on file  . Intimate partner violence:    Fear of current or ex partner: Not on file    Emotionally abused: Not on file    Physically abused: Not on file    Forced sexual activity: Not on file  Other Topics Concern  . Not on file  Social History Narrative  . Not on file    No past surgical history on file.  No family history on file.  No Known Allergies  Current Outpatient Medications on File Prior to Visit  Medication Sig Dispense Refill  . Cyanocobalamin (VITAMIN B 12 PO) Take 1 tablet daily by mouth.    . megestrol (MEGACE) 40 MG tablet TAKE 3  TABLETS AT THE SAME TIME DAILY FOR 5 DAYS, THEN 2 TABLETS FOR 5 DAYS, THEN 1 TABLET DAILY 45 tablet 3  . naproxen sodium (ALEVE) 220 MG tablet Take 440 mg 2 (two) times daily as needed by mouth.     No current facility-administered medications on file prior to visit.     BP (!) 141/88   Pulse 78   Temp 98.1 F (36.7 C) (Oral)   Resp 16   Ht 5' 0.5" (1.537 m)   Wt 203 lb 12.8 oz (92.4 kg)   SpO2 100%   BMI 39.15 kg/m       Objective:   Physical Exam  General Mental Status- Alert. General Appearance- Not in acute distress.   Skin General: Color- Normal Color. Moisture- Normal Moisture.  Neck Carotid Arteries- Normal color. Moisture- Normal Moisture. No carotid  bruits. No JVD.  Chest and Lung Exam Auscultation: Breath Sounds:-Normal.  Cardiovascular Auscultation:Rythm- Regular. Murmurs & Other Heart Sounds:Auscultation of the heart reveals- No Murmurs.  Abdomen Inspection:-Inspeection Normal. Palpation/Percussion:Note:No mass. Palpation and Percussion of the abdomen reveal- Non Tender, Non Distended + BS, no rebound or guarding.  Neurologic Cranial Nerve exam:- CN III-XII intact(No nystagmus), symmetric smile. Strength:- 5/5 equal and symmetric strength both upper and lower extremities.      Assessment & Plan:  For you wellness exam today I have ordered cbc, cmp, lipid panel, ua and hiv.  Vaccine up to date. If you can find out when last tdap was given.  Recommend exercise and healthy diet.  We will let you know lab results as they come in.  Follow up date appointment will be determined after lab review.   Sign release form so we can get old records.   See gyn next week for pcos and infertility.   Mackie Pai, PA-C

## 2018-06-06 NOTE — Addendum Note (Signed)
Addended by: Hinton Dyer on: 06/06/2018 10:00 AM   Modules accepted: Orders

## 2018-06-06 NOTE — Patient Instructions (Addendum)
For you wellness exam today I have ordered cbc, cmp, lipid panel, ua and hiv.  Vaccine up to date. If you can find out when last tdap was given.  Recommend exercise and healthy diet.  We will let you know lab results as they come in.  Follow up date appointment will be determined after lab review.   Sign release form so we can get old records.   See gyn next week for pcos and infertility.  Preventive Care 18-39 Years, Female Preventive care refers to lifestyle choices and visits with your health care provider that can promote health and wellness. What does preventive care include?   A yearly physical exam. This is also called an annual well check.  Dental exams once or twice a year.  Routine eye exams. Ask your health care provider how often you should have your eyes checked.  Personal lifestyle choices, including: ? Daily care of your teeth and gums. ? Regular physical activity. ? Eating a healthy diet. ? Avoiding tobacco and drug use. ? Limiting alcohol use. ? Practicing safe sex. ? Taking vitamin and mineral supplements as recommended by your health care provider. What happens during an annual well check? The services and screenings done by your health care provider during your annual well check will depend on your age, overall health, lifestyle risk factors, and family history of disease. Counseling Your health care provider may ask you questions about your:  Alcohol use.  Tobacco use.  Drug use.  Emotional well-being.  Home and relationship well-being.  Sexual activity.  Eating habits.  Work and work Statistician.  Method of birth control.  Menstrual cycle.  Pregnancy history. Screening You may have the following tests or measurements:  Height, weight, and BMI.  Diabetes screening. This is done by checking your blood sugar (glucose) after you have not eaten for a while (fasting).  Blood pressure.  Lipid and cholesterol levels. These may be  checked every 5 years starting at age 11.  Skin check.  Hepatitis C blood test.  Hepatitis B blood test.  Sexually transmitted disease (STD) testing.  BRCA-related cancer screening. This may be done if you have a family history of breast, ovarian, tubal, or peritoneal cancers.  Pelvic exam and Pap test. This may be done every 3 years starting at age 69. Starting at age 39, this may be done every 5 years if you have a Pap test in combination with an HPV test. Discuss your test results, treatment options, and if necessary, the need for more tests with your health care provider. Vaccines Your health care provider may recommend certain vaccines, such as:  Influenza vaccine. This is recommended every year.  Tetanus, diphtheria, and acellular pertussis (Tdap, Td) vaccine. You may need a Td booster every 10 years.  Varicella vaccine. You may need this if you have not been vaccinated.  HPV vaccine. If you are 53 or younger, you may need three doses over 6 months.  Measles, mumps, and rubella (MMR) vaccine. You may need at least one dose of MMR. You may also need a second dose.  Pneumococcal 13-valent conjugate (PCV13) vaccine. You may need this if you have certain conditions and were not previously vaccinated.  Pneumococcal polysaccharide (PPSV23) vaccine. You may need one or two doses if you smoke cigarettes or if you have certain conditions.  Meningococcal vaccine. One dose is recommended if you are age 68-21 years and a first-year college student living in a residence hall, or if you have one of  several medical conditions. You may also need additional booster doses.  Hepatitis A vaccine. You may need this if you have certain conditions or if you travel or work in places where you may be exposed to hepatitis A.  Hepatitis B vaccine. You may need this if you have certain conditions or if you travel or work in places where you may be exposed to hepatitis B.  Haemophilus influenzae type b  (Hib) vaccine. You may need this if you have certain risk factors. Talk to your health care provider about which screenings and vaccines you need and how often you need them. This information is not intended to replace advice given to you by your health care provider. Make sure you discuss any questions you have with your health care provider. Document Released: 06/09/2001 Document Revised: 11/24/2016 Document Reviewed: 02/12/2015 Elsevier Interactive Patient Education  2019 [employer].

## 2018-06-06 NOTE — Telephone Encounter (Signed)
Will you put in her urine poct order and result. Urine in bathroom

## 2018-06-07 LAB — HIV ANTIBODY (ROUTINE TESTING W REFLEX): HIV: NONREACTIVE

## 2018-08-22 MED FILL — LETROZOLE 2.5 MG TABLET: 2.5 | 30 days supply | Qty: 30 | Fill #0

## 2018-08-24 ENCOUNTER — Telehealth: Payer: Self-pay

## 2018-08-24 NOTE — Telephone Encounter (Signed)
Copied from [place] [id number]. Topic: General - Other >> Aug 24, 2018 10:30 AM Keene Breath wrote: Reason for CRM: Patient called to schedule an appt. With the doctor regarding her allergies.  Please call patient back at [phone]

## 2018-08-25 ENCOUNTER — Encounter: Payer: Self-pay | Admitting: Medical

## 2018-08-25 ENCOUNTER — Other Ambulatory Visit: Payer: Self-pay

## 2018-08-25 ENCOUNTER — Ambulatory Visit (INDEPENDENT_AMBULATORY_CARE_PROVIDER_SITE_OTHER): Payer: No Typology Code available for payment source | Admitting: Medical

## 2018-08-25 DIAGNOSIS — R05 Cough: Secondary | ICD-10-CM

## 2018-08-25 DIAGNOSIS — J301 Allergic rhinitis due to pollen: Secondary | ICD-10-CM | POA: Diagnosis not present

## 2018-08-25 DIAGNOSIS — R059 Cough, unspecified: Secondary | ICD-10-CM

## 2018-08-25 MED ORDER — FLUTICASONE PROPIONATE 50 MCG/ACT NA SUSP: 2.0000 | Freq: Every day | NASAL | 1 refills | Status: DC

## 2018-08-25 MED ORDER — LEVOCETIRIZINE DIHYDROCHLORIDE 5 MG PO TABS: 5.0000 mg | ORAL_TABLET | Freq: Every evening | ORAL | 3 refills | Status: DC

## 2018-08-25 NOTE — Progress Notes (Signed)
   Subjective:    Patient ID: Lori Gillespie, female    DOB: January 14, 1991, 28 y.o.   MRN: [id number]  HPI  Virtual Visit via Video Note  I connected with Lori Gillespie on 08/25/18 at  3:00 PM EDT by a video enabled telemedicine application and verified that I am speaking with the correct person using two identifiers.  Location: Patient: home. Provider: office.   I discussed the limitations of evaluation and management by telemedicine and the availability of in person appointments. The patient expressed understanding and agreed to proceed.  History of Present Illness:  Pt states she has a lot of runny nose, itching to throat and pnd. Also of sneezing. Pt has tried tried zyrtec and helped all symptoms but did not stop throat itching/irriation. No sob, no wheezing or swollen lips.  No fever. No sinus pressure. If blows nose only no yellow dc. No sinus pressure. No chest congestion  LMP- 1st week of April   Observations/Objective:  No acute distress. Normal affect.nasal congestion. Heent- no sinus pressure.   Assessment and Plan: Appear to have spring allergies. Rx xyzal and flonase. Will follow closely if symptoms don't improve let me know and will add montelukast.  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  Follow up date depending on clinical response.to above treatment  Mackie Pai, PA-C  Follow Up Instructions:    I discussed the assessment and treatment plan with the patient. The patient was provided an opportunity to ask questions and all were answered. The patient agreed with the plan and demonstrated an understanding of the instructions.   The patient was advised to call back or seek an in-person evaluation if the symptoms worsen or if the condition fails to improve as anticipated.     Mackie Pai, PA-C   Review of Systems  Constitutional: Negative for chills, fatigue and fever.  HENT: Positive for  congestion, postnasal drip and sneezing. Negative for sinus pressure, sinus pain, sore throat and trouble swallowing.   Respiratory: Positive for cough. Negative for shortness of breath and wheezing.        Cough associatred with allergies.  Cardiovascular: Negative for chest pain and palpitations.  Gastrointestinal: Negative for abdominal pain.  Musculoskeletal: Negative for back pain.  Skin: Negative for rash.  Neurological: Negative for headaches.  Hematological: Negative for adenopathy. Does not bruise/bleed easily.       Objective:   Physical Exam        Assessment & Plan:

## 2018-08-25 NOTE — Telephone Encounter (Signed)
Left pt a message to call to schedule vov.

## 2018-08-25 NOTE — Patient Instructions (Signed)
Appear to have spring allergies. Rx xyzal and flonase. Will follow closely if symptoms don't improve let me know and will add montelukast.  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  Follow up date depending on clinical response.to above treatment

## 2018-09-20 MED FILL — DANAZOL 200 MG CAPSULE: 200 | 30 days supply | Qty: 60 | Fill #0

## 2018-09-20 MED FILL — LETROZOLE 2.5 MG TABLET: 2.5 | 30 days supply | Qty: 30 | Fill #1

## 2018-10-20 MED FILL — DANAZOL 200 MG CAPSULE: 200 | 30 days supply | Qty: 60 | Fill #1

## 2018-10-20 MED FILL — LETROZOLE 2.5 MG TABLET: 2.5 | 30 days supply | Qty: 30 | Fill #2

## 2018-10-21 ENCOUNTER — Other Ambulatory Visit: Payer: Self-pay | Admitting: Obstetrics and Gynecology

## 2018-10-21 DIAGNOSIS — D250 Submucous leiomyoma of uterus: Secondary | ICD-10-CM

## 2018-10-21 DIAGNOSIS — D259 Leiomyoma of uterus, unspecified: Secondary | ICD-10-CM

## 2018-10-24 ENCOUNTER — Telehealth: Payer: Self-pay

## 2018-10-24 NOTE — Telephone Encounter (Signed)
I just got back in town? What fmla. Have not seen any?

## 2018-10-24 NOTE — Telephone Encounter (Signed)
Pt called in to schedule an appt with PCP to discuss ppwk. Pt says that she would like to update PCP with why she need forms completed face to face first. Pt would like for provider to hold on to ppwk and complete at her appt if possible.

## 2018-10-24 NOTE — Telephone Encounter (Signed)
FMLA paper was place in folder.

## 2018-10-24 NOTE — Telephone Encounter (Signed)
Copied from [place] [id number]. Topic: Appointment Scheduling - Scheduling Inquiry for Clinic >> Oct 24, 2018 12:09 PM Percell Belt A wrote: Reason for CRM: pt called in and stated that her Employer ([employer]) faxed over paperwork for FMLA.  She wanted to know if this as been rec'd?  What is the status?  Best number -[phone]

## 2018-10-24 NOTE — Telephone Encounter (Signed)
Pt dropped off forms last week when I was not in office. What is this form for? For her own medical condition or her family member. If for her condition she needs to schedule appointment as she only has allergies that I have seen her for recently. If for family member condition I need to see him to get understanding of what his current state is and what demands are placed on her due to family members illness.Such as is family member incapacitated, needing various hospital visit etc.  So pt or family member needs appointment with me.  Assuming her family member is pt of mine?  Soonest can fill out form would likley be this coming Monday as that would be 7 days from when I saw form/first had opportunity to fill.  Pt called already for update. So if you could explain I have not been in town.

## 2018-10-24 NOTE — Telephone Encounter (Signed)
Copied from [place] [id number]. Topic: General - Other >> Oct 20, 2018  1:29 PM Nils Flack wrote: Reason for CRM: pt is wanting to know if her flma is ready Please call [phone]

## 2018-10-25 ENCOUNTER — Ambulatory Visit (INDEPENDENT_AMBULATORY_CARE_PROVIDER_SITE_OTHER): Payer: No Typology Code available for payment source | Admitting: Medical

## 2018-10-25 ENCOUNTER — Other Ambulatory Visit: Payer: Self-pay

## 2018-10-25 DIAGNOSIS — Z6379 Other stressful life events affecting family and household: Secondary | ICD-10-CM | POA: Diagnosis not present

## 2018-10-25 DIAGNOSIS — N979 Female infertility, unspecified: Secondary | ICD-10-CM

## 2018-10-25 NOTE — Patient Instructions (Signed)
For family illness, I filled out majority of fmla form and plan to review/finalize paperwork later this week and fax paperwork at latest by early next week.  For infertility recommend continue work up with specialist. [place] work up and treatment goes well.  Follow up as needed

## 2018-10-25 NOTE — Progress Notes (Signed)
   Subjective:    Patient ID: Lori Gillespie, female    DOB: 1990-11-25, 28 y.o.   MRN: [id number]  HPI  Virtual Visit via Video Note  I connected with Lori Gillespie on 10/25/18 at  4:00 PM EDT by a video enabled telemedicine application and verified that I am speaking with the correct person using two identifiers.  Location: Patient: home.  Provider: office   Pt did not check bp today   I discussed the limitations of evaluation and management by telemedicine and the availability of in person appointments. The patient expressed understanding and agreed to proceed.  History of Present Illness: Pt actually has concern about and need to get  fmla  Paper work for her husband illness for which she missed days from work. He has gastritis and ulcer. Pt missed 4 days related to her husband GI illness and gi procedures.  Pt states her first 2 day increment was denied. But needs paper work filled out to cover June 23rd- June 26 /days  missed to take  care of her husband post procedure.   Filled out pt forms for her fmla. Seeing her husband on this Thursday and will ED visit and procedure note by GI in detail.  Pt gives me update on her infertility. She is going to get mri done and then see gyn.This being done to evaluate uterine fibroids. Pt has tried medication to shrink fibroids. But if not helping will have procedure.     Observations/Objective: General-no acute distress, pleasant, oriented. Lungs- on inspection lungs appear unlabored. Neck- no tracheal deviation or jvd on inspection. Neuro- gross motor function appears intact.  Assessment and Plan: For family illness, I filled out majority of fmla form and plan to review/finalize paperwork later this week and fax paperwork at latest by early next week.  For infertility recommend continue work up with specialist. [place] work up and treatment goes well.  Follow up as needed  Mackie Pai, PA-C  Follow Up Instructions:     I discussed the assessment and treatment plan with the patient. The patient was provided an opportunity to ask questions and all were answered. The patient agreed with the plan and demonstrated an understanding of the instructions.   The patient was advised to call back or seek an in-person evaluation if the symptoms worsen or if the condition fails to improve as anticipated.  I provided 25 +minutes of non-face-to-face time during this encounter. Discussed and counseled pt on how I would fill out form. Answered patients questions on form as well.   Mackie Pai, PA-C    Review of Systems  Constitutional: Negative for chills, fatigue and fever.  HENT: Negative for congestion, ear discharge, ear pain, postnasal drip, rhinorrhea, sinus pressure and sneezing.   Respiratory: Negative for cough, chest tightness, shortness of breath and wheezing.   Cardiovascular: Negative for chest pain and palpitations.  Gastrointestinal: Negative for abdominal pain.  Musculoskeletal: Negative for back pain and neck pain.  Skin: Negative for rash.  Neurological: Negative for dizziness, speech difficulty, weakness, numbness and headaches.  Hematological: Negative for adenopathy.  Psychiatric/Behavioral: Negative for behavioral problems, decreased concentration, dysphoric mood and sleep disturbance. The patient is not nervous/anxious.        Objective:   Physical Exam        Assessment & Plan:

## 2018-10-27 ENCOUNTER — Encounter: Payer: Self-pay | Admitting: Medical

## 2018-11-01 ENCOUNTER — Telehealth: Payer: Self-pay | Admitting: Medical

## 2018-11-01 NOTE — Telephone Encounter (Signed)
I gave fmla paperwork yesterday. Pt stating we did not fax it yet? Did you do that yeserday.

## 2018-11-14 ENCOUNTER — Other Ambulatory Visit: Payer: No Typology Code available for payment source

## 2018-11-17 ENCOUNTER — Encounter: Payer: Self-pay | Admitting: Medical

## 2018-11-18 ENCOUNTER — Other Ambulatory Visit: Payer: Self-pay

## 2018-11-18 ENCOUNTER — Ambulatory Visit (INDEPENDENT_AMBULATORY_CARE_PROVIDER_SITE_OTHER): Payer: No Typology Code available for payment source | Admitting: Medical

## 2018-11-18 ENCOUNTER — Encounter: Payer: Self-pay | Admitting: Medical

## 2018-11-18 VITALS — BP 131/96 | HR 72 | Temp 98.3°F | Resp 16 | Ht 60.5 in | Wt 222.0 lb

## 2018-11-18 DIAGNOSIS — Z8489 Family history of other specified conditions: Secondary | ICD-10-CM

## 2018-11-18 NOTE — Patient Instructions (Signed)
I did modify your FMLA form today.  On the form as we discussed.  I asked Jasmine to fax over the form but we also gave you a copy of the form as well.  Hopefully these are numbered days will be adequate enough to meet you and your husband's time needs.  If your employer/HR has issues with number of days given please let me know.  Follow-up as needed.

## 2018-11-18 NOTE — Progress Notes (Signed)
Subjective:    Patient ID: Lori Gillespie, female    DOB: 1991-04-15, 28 y.o.   MRN: [id number]  HPI  Pt in for follow up.   She had fmla for her husbands health issues. I had put on pt notes frequency of events 2 per year with 2 days each episode. Pt husband some recurrent gi issues. He has hx of stomach ulcers. Pt may need more time since upcoming recurrent studies. Pt needs more time.  Patient has talk with her work and they think she needs more days allotted.  After discussion with patient decided to  put 3 events every 2 months. Each event every 4 days.  Patient's husband is a patient of mine as well.  His condition is appearing to be more complicated than originally expected and therefore will give patient more days as explained above.  Review of Systems  Constitutional: Negative for chills, fatigue and fever.  Respiratory: Negative for cough, chest tightness, shortness of breath and wheezing.   Cardiovascular: Negative for chest pain and palpitations.  Gastrointestinal: Negative for abdominal pain.  Neurological: Negative for dizziness, seizures, syncope, weakness and headaches.  Hematological: Negative for adenopathy. Does not bruise/bleed easily.    Past Medical History:  Diagnosis Date  . Abnormal uterine bleeding   . Abnormal uterine bleeding   . Anemia   . Fibroids   . PCOS (polycystic ovarian syndrome)      Social History   Socioeconomic History  . Marital status: Married    Spouse name: Not on file  . Number of children: Not on file  . Years of education: Not on file  . Highest education level: Not on file  Occupational History  . Not on file  Social Needs  . Financial resource strain: Not on file  . Food insecurity    Worry: Not on file    Inability: Not on file  . Transportation needs    Medical: Not on file    Non-medical: Not on file  Tobacco Use  . Smoking status: Never Smoker  . Smokeless tobacco: Never Used  Substance and Sexual Activity   . Alcohol use: No  . Drug use: No  . Sexual activity: Not on file  Lifestyle  . Physical activity    Days per week: Not on file    Minutes per session: Not on file  . Stress: Not on file  Relationships  . Social Herbalist on phone: Not on file    Gets together: Not on file    Attends religious service: Not on file    Active member of club or organization: Not on file    Attends meetings of clubs or organizations: Not on file    Relationship status: Not on file  . Intimate partner violence    Fear of current or ex partner: Not on file    Emotionally abused: Not on file    Physically abused: Not on file    Forced sexual activity: Not on file  Other Topics Concern  . Not on file  Social History Narrative  . Not on file    No past surgical history on file.  No family history on file.  No Known Allergies  Current Outpatient Medications on File Prior to Visit  Medication Sig Dispense Refill  . Cyanocobalamin (VITAMIN B 12 PO) Take 1 tablet daily by mouth.    . danazol (DANOCRINE) 200 MG capsule 200 mg.    . letrozole (FEMARA) 2.5 MG  tablet 2.5 mg.    . megestrol (MEGACE) 40 MG tablet TAKE 3 TABLETS AT THE SAME TIME DAILY FOR 5 DAYS, THEN 2 TABLETS FOR 5 DAYS, THEN 1 TABLET DAILY 45 tablet 3  . naproxen sodium (ALEVE) 220 MG tablet Take 440 mg 2 (two) times daily as needed by mouth.     No current facility-administered medications on file prior to visit.     BP (!) 131/96   Pulse 72   Temp 98.3 F (36.8 C) (Oral)   Resp 16   Ht 5' 0.5" (1.537 m)   Wt 222 lb (100.7 kg)   SpO2 98%   BMI 42.64 kg/m        Objective:   Physical Exam  General- No acute distress. Pleasant patient. Lungs- Clear, even and unlabored. Heart- regular rate and rhythm. Neurologic- CNII- XII grossly intact.       Assessment & Plan:   did modify your FMLA form today.  On the form as we discussed.  I asked Jasmine to fax over the form but we also gave you a copy of the form  as well.  Hopefully these are numbered days will be adequate enough to meet you and your husband's time needs.  If your employer/HR has issues with number of days given please let me know.  Follow-up as needed.  Mackie Pai, PA-C

## 2018-11-21 ENCOUNTER — Ambulatory Visit (INDEPENDENT_AMBULATORY_CARE_PROVIDER_SITE_OTHER): Payer: No Typology Code available for payment source

## 2018-11-21 ENCOUNTER — Other Ambulatory Visit: Payer: Self-pay

## 2018-11-21 DIAGNOSIS — D259 Leiomyoma of uterus, unspecified: Secondary | ICD-10-CM | POA: Diagnosis not present

## 2018-11-21 DIAGNOSIS — D250 Submucous leiomyoma of uterus: Secondary | ICD-10-CM | POA: Diagnosis not present

## 2018-11-21 MED ORDER — GADOBUTROL 1 MMOL/ML IV SOLN
9.0000 mL | Freq: Once | INTRAVENOUS | Status: AC | PRN
  Administered 2018-11-21: 9 mL via INTRAVENOUS

## 2018-11-22 MED FILL — DANAZOL 200 MG CAPSULE: 200 | 30 days supply | Qty: 60 | Fill #0

## 2018-11-22 MED FILL — LETROZOLE 2.5 MG TABLET: 2.5 | 30 days supply | Qty: 30 | Fill #0

## 2018-11-23 MED FILL — DANAZOL 200 MG CAPSULE: 200 | 30 days supply | Qty: 60 | Fill #0

## 2018-12-19 MED FILL — DANAZOL 200 MG CAPSULE: 200 | 30 days supply | Qty: 60 | Fill #1

## 2018-12-19 MED FILL — LETROZOLE 2.5 MG TABLET: 2.5 | 30 days supply | Qty: 30 | Fill #1

## 2019-01-18 MED FILL — LETROZOLE 2.5 MG TABLET: 2.5 | 30 days supply | Qty: 30 | Fill #2

## 2019-01-18 MED FILL — DANAZOL 200 MG CAPSULE: 200 | 30 days supply | Qty: 60 | Fill #2

## 2019-02-16 MED FILL — LETROZOLE 2.5 MG TABLET: 2.5 | 30 days supply | Qty: 30 | Fill #3

## 2019-02-20 MED FILL — DANAZOL 200 MG CAPSULE: 200 | 30 days supply | Qty: 60 | Fill #3

## 2019-02-28 ENCOUNTER — Encounter: Payer: Self-pay | Admitting: Medical

## 2019-03-08 ENCOUNTER — Other Ambulatory Visit: Payer: Self-pay

## 2019-03-09 ENCOUNTER — Ambulatory Visit (INDEPENDENT_AMBULATORY_CARE_PROVIDER_SITE_OTHER): Payer: No Typology Code available for payment source | Admitting: Medical

## 2019-03-09 ENCOUNTER — Other Ambulatory Visit: Payer: Self-pay

## 2019-03-09 ENCOUNTER — Encounter: Payer: Self-pay | Admitting: Medical

## 2019-03-09 VITALS — BP 139/88 | HR 91 | Temp 96.9°F | Resp 16 | Ht 60.5 in | Wt 222.6 lb

## 2019-03-09 DIAGNOSIS — R030 Elevated blood-pressure reading, without diagnosis of hypertension: Secondary | ICD-10-CM

## 2019-03-09 DIAGNOSIS — N979 Female infertility, unspecified: Secondary | ICD-10-CM

## 2019-03-09 DIAGNOSIS — D219 Benign neoplasm of connective and other soft tissue, unspecified: Secondary | ICD-10-CM

## 2019-03-09 NOTE — Patient Instructions (Addendum)
Your bp is elevated today initially but borderline high on repeat. Recommend low salt diet, exercise and check bp 2-3 times per week. If higher than 140/90 then would recommend start bp meds as in recent past borderline bp levels.  For infertility and fibroids follow advise of gyn.   Work excuse written and get employer to fax over new/blank fmla forms.  Follow up 6 months or as needed

## 2019-03-09 NOTE — Telephone Encounter (Signed)
Pt being seen today 03-09-2019. Done

## 2019-03-09 NOTE — Progress Notes (Signed)
Subjective:    Patient ID: Lori Gillespie, female    DOB: [birth year], 28 y.o.   MRN: [id number]  HPI   Pt in for follow up.  Pt states she needs extension of her FMLA for her husband medical issues. [employer] makes persons renew every 6 months. I have not received any recent blank paperwork   Pt has request to get 2 events each month.(4 events per 2 months). Pt has 4 days per event. Missing work related to her husbands GI issues/illness.  Pt missed work yesterday and today. States exceeding time frame on current fmla.  Pt update me that she is going through infertility tx. Pt states she is planning to do ivf. She is planning to get surgery to remove fibroids before doing ivf.   Pt has elevated blood pressure today. Up and down mild elevation. No cardiac or neurologic signs or symptoms.       Review of Systems  Constitutional: Negative for chills, fatigue and fever.  Respiratory: Negative for cough, chest tightness, shortness of breath and wheezing.   Cardiovascular: Negative for chest pain and palpitations.  Gastrointestinal: Negative for abdominal distention, abdominal pain, blood in stool, constipation and nausea.  Musculoskeletal: Negative for back pain.  Hematological: Negative for adenopathy. Does not bruise/bleed easily.  Psychiatric/Behavioral: Negative for behavioral problems and decreased concentration.    Past Medical History:  Diagnosis Date  . Abnormal uterine bleeding   . Abnormal uterine bleeding   . Anemia   . Fibroids   . PCOS (polycystic ovarian syndrome)      Social History   Socioeconomic History  . Marital status: Married    Spouse name: Not on file  . Number of children: Not on file  . Years of education: Not on file  . Highest education level: Not on file  Occupational History  . Not on file  Social Needs  . Financial resource strain: Not on file  . Food insecurity    Worry: Not on file    Inability: Not on file  . Transportation needs     Medical: Not on file    Non-medical: Not on file  Tobacco Use  . Smoking status: Never Smoker  . Smokeless tobacco: Never Used  Substance and Sexual Activity  . Alcohol use: No  . Drug use: No  . Sexual activity: Not on file  Lifestyle  . Physical activity    Days per week: Not on file    Minutes per session: Not on file  . Stress: Not on file  Relationships  . Social Herbalist on phone: Not on file    Gets together: Not on file    Attends religious service: Not on file    Active member of club or organization: Not on file    Attends meetings of clubs or organizations: Not on file    Relationship status: Not on file  . Intimate partner violence    Fear of current or ex partner: Not on file    Emotionally abused: Not on file    Physically abused: Not on file    Forced sexual activity: Not on file  Other Topics Concern  . Not on file  Social History Narrative  . Not on file    No past surgical history on file.  No family history on file.  No Known Allergies  Current Outpatient Medications on File Prior to Visit  Medication Sig Dispense Refill  . Cyanocobalamin (VITAMIN B 12 PO) Take  1 tablet daily by mouth.    . danazol (DANOCRINE) 200 MG capsule 200 mg.    . letrozole (FEMARA) 2.5 MG tablet 2.5 mg.    . megestrol (MEGACE) 40 MG tablet TAKE 3 TABLETS AT THE SAME TIME DAILY FOR 5 DAYS, THEN 2 TABLETS FOR 5 DAYS, THEN 1 TABLET DAILY 45 tablet 3  . naproxen sodium (ALEVE) 220 MG tablet Take 440 mg 2 (two) times daily as needed by mouth.     No current facility-administered medications on file prior to visit.     BP (!) 138/101   Pulse 91   Temp (!) 96.9 F (36.1 C) (Temporal)   Resp 16   Ht 5' 0.5" (1.537 m)   Wt 222 lb 9.6 oz (101 kg)   SpO2 98%   BMI 42.76 kg/m       Objective:   Physical Exam  General Mental Status- Alert. General Appearance- Not in acute distress.   Skin General: Color- Normal Color. Moisture- Normal Moisture.  Neck  Carotid Arteries- Normal color. Moisture- Normal Moisture. No carotid bruits. No JVD.  Chest and Lung Exam Auscultation: Breath Sounds:-Normal.  Cardiovascular Auscultation:Rythm- Regular. Murmurs & Other Heart Sounds:Auscultation of the heart reveals- No Murmurs.  Abdomen Inspection:-Inspeection Normal. Palpation/Percussion:Note:No mass. Palpation and Percussion of the abdomen reveal- Non Tender, Non Distended + BS, no rebound or guarding.   Neurologic Cranial Nerve exam:- CN III-XII intact(No nystagmus), symmetric smile. Strength:- 5/5 equal and symmetric strength both upper and lower extremities.      Assessment & Plan:  Your bp is elevated today initially but borderline high on repeat. Recommend low salt diet, exercise and check bp 2-3 times per week. If higher than 140/90 then would recommend start bp meds as in recent past borderline bp levels.  For infertility and fibroids follow advise of gyn.   Work excuse written and get employer to fax over new/blank fmla forms.  Follow up 6 months or as needed  25 + minutes spent with pt. 50% of time spent counseling on plan going forward.

## 2019-03-15 MED FILL — LETROZOLE 2.5 MG TABLET: 2.5 | 30 days supply | Qty: 30 | Fill #4

## 2019-03-31 ENCOUNTER — Telehealth: Payer: Self-pay | Admitting: Medical

## 2019-03-31 MED FILL — DANAZOL 200 MG CAPSULE: 200 | 30 days supply | Qty: 60 | Fill #4

## 2019-03-31 NOTE — Telephone Encounter (Signed)
Appointment schedule for 12/7

## 2019-03-31 NOTE — Telephone Encounter (Signed)
Will you let pt know that I finally got blank forms for fmla but that was on 03/22/2019 and it came under [place] name. So I initially was thinking I haven't seen him in a while. When looked at forms in detail saw her name.   So I wanted intermittent leave number of days and frequency she thinks will need over next year. So want brief phone virtual visit.

## 2019-04-03 ENCOUNTER — Telehealth: Payer: Self-pay | Admitting: Medical

## 2019-04-03 ENCOUNTER — Ambulatory Visit: Payer: No Typology Code available for payment source | Admitting: Medical

## 2019-04-03 ENCOUNTER — Other Ambulatory Visit: Payer: Self-pay

## 2019-04-03 NOTE — Progress Notes (Signed)
   Subjective:    Patient ID: Lori Gillespie, female    DOB: [birth year], 28 y.o.   MRN: [id number]  HPI  No charge for office visit. But will charge pt for paperwork filled out today. Fmla for her husband illness. Only charge paperwork fee. But no office visit.  Review of Systems     Objective:   Physical Exam        Assessment & Plan:

## 2019-04-03 NOTE — Telephone Encounter (Signed)
Will you fax over Pt fmla form. Then put copy up front so she can pick it up.

## 2019-04-04 NOTE — Telephone Encounter (Signed)
Form faxed and put up front for pickup.

## 2019-04-19 MED FILL — LETROZOLE 2.5 MG TABLET: 2.5 | 30 days supply | Qty: 30 | Fill #5

## 2019-05-05 MED FILL — DANAZOL 200 MG CAPSULE: 200 | 30 days supply | Qty: 60 | Fill #5

## 2019-05-19 MED FILL — LETROZOLE 2.5 MG TABLET: 2.5 | 30 days supply | Qty: 30 | Fill #6

## 2019-05-24 ENCOUNTER — Other Ambulatory Visit: Payer: Self-pay

## 2019-05-24 ENCOUNTER — Ambulatory Visit (INDEPENDENT_AMBULATORY_CARE_PROVIDER_SITE_OTHER): Payer: No Typology Code available for payment source | Admitting: Medical

## 2019-05-24 ENCOUNTER — Encounter: Payer: Self-pay | Admitting: Medical

## 2019-05-24 VITALS — Ht 61.0 in | Wt 217.0 lb

## 2019-05-24 DIAGNOSIS — R030 Elevated blood-pressure reading, without diagnosis of hypertension: Secondary | ICD-10-CM | POA: Diagnosis not present

## 2019-05-24 DIAGNOSIS — Z6379 Other stressful life events affecting family and household: Secondary | ICD-10-CM

## 2019-05-24 NOTE — Patient Instructions (Signed)
For hx of elevated bp I want you to check bp 3 time a week, bring bp reading in on follow up in one month. Will check your bp and see if medication needed. During interim advise low salt diet and some daily exercise.  For family illness refilled her fmla sheets today as she requested after she consulted with her HR. Will leave paperwork up front for her to pick up.  Follow up one month or as needed

## 2019-05-24 NOTE — Progress Notes (Signed)
   Subjective:    Patient ID: Lori Gillespie, female    DOB: 1990/11/09, 29 y.o.   MRN: [id number]  HPI  Virtual Visit via Telephone Note  I connected with Lori Gillespie on 05/24/19 at  2:20 PM EST by telephone and verified that I am speaking with the correct person using two identifiers.  Location: Patient: home Provider: office   I discussed the limitations, risks, security and privacy concerns of performing an evaluation and management service by telephone and the availability of in person appointments. I also discussed with the patient that there may be a patient responsible charge related to this service. The patient expressed understanding and agreed to proceed.   History of Present Illness:  Pt has hx of missing work when her husband needs to be evaluated for his medical problems. Pt states her work/ hr recommended she has one more episode. She had to be out more days since she had exposure to covid and need to be quarantined due to waiting to be tested.   So needs 4 episodes and same number of days per episode.  So filled out paperwork.  Pt also has borderline elvated bp. No bp check today. Last 3 readings on flow sheet show borderline bp. No cardiac or neurologist signs/symptoms.     Observations/Objective:  General- no acute distress, pleasant, alert and oriented. Normal speech  Assessment and Plan: For hx of elevated bp I want you to check bp 3 time a week, bring bp reading in on follow up in one month. Will check your bp and see if medication needed. During interim advise low salt diet and some daily exercise.  For family illness refilled her fmla sheets today as she requested after she consulted with her HR. Will leave paperwork up front for her to pick up.  Follow up one month or as needed  Follow Up Instructions:    I discussed the assessment and treatment plan with the patient. The patient was provided an opportunity to ask questions and all were  answered. The patient agreed with the plan and demonstrated an understanding of the instructions.   The patient was advised to call back or seek an in-person evaluation if the symptoms worsen or if the condition fails to improve as anticipated.  I provided 20 minutes of non-face-to-face time during this encounter.   Mackie Pai, PA-C   Review of Systems     Objective:   Physical Exam        Assessment & Plan:

## 2019-05-26 ENCOUNTER — Ambulatory Visit: Payer: No Typology Code available for payment source | Admitting: Medical

## 2019-06-01 MED FILL — DANAZOL 200 MG CAPSULE: 200 | 30 days supply | Qty: 60 | Fill #6

## 2019-06-19 MED FILL — LETROZOLE 2.5 MG TABLET: 2.5 | 30 days supply | Qty: 30 | Fill #7

## 2019-07-15 MED FILL — LETROZOLE 2.5 MG TABLET: 2.5 | 30 days supply | Qty: 30 | Fill #8

## 2019-07-15 MED FILL — DANAZOL 200 MG CAPSULE: 200 | 30 days supply | Qty: 60 | Fill #7

## 2019-08-10 MED FILL — DANAZOL 200 MG CAPSULE: 200 | 30 days supply | Qty: 60 | Fill #8

## 2019-08-10 MED FILL — LETROZOLE 2.5 MG TABLET: 2.5 | 30 days supply | Qty: 30 | Fill #9

## 2019-09-19 ENCOUNTER — Ambulatory Visit (INDEPENDENT_AMBULATORY_CARE_PROVIDER_SITE_OTHER): Payer: No Typology Code available for payment source | Admitting: Medical

## 2019-09-19 ENCOUNTER — Encounter: Payer: Self-pay | Admitting: Medical

## 2019-09-19 ENCOUNTER — Other Ambulatory Visit: Payer: Self-pay

## 2019-09-19 VITALS — BP 120/80 | HR 99 | Resp 18 | Ht 61.0 in | Wt 222.0 lb

## 2019-09-19 DIAGNOSIS — M79641 Pain in right hand: Secondary | ICD-10-CM

## 2019-09-19 DIAGNOSIS — M25531 Pain in right wrist: Secondary | ICD-10-CM | POA: Diagnosis not present

## 2019-09-19 NOTE — Progress Notes (Signed)
Subjective:    Patient ID: Lori Gillespie, female    DOB: 1990/12/14, 29 y.o.   MRN: [id number]  HPI  Pt in for rt arm pain. She states pain has been present for 5 days. No pain in neck.  Pain started in her rt hand and in her wrist. Pt is right handed. 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  In the past pt would notice mild pain at night in her wrist. Sometimes in morning when woke up her rt hand would tingle.   Over last 5 days she has severe pain that  radiates from rt hand up to her proximal bicep. But pain in wrist is much worse.  Pt has tried alleve but did not help.  No fall or trauma.   Review of Systems  Constitutional: Negative for chills, fatigue and fever.  Respiratory: Negative for cough, chest tightness and wheezing.   Cardiovascular: Negative for chest pain and palpitations.  Gastrointestinal: Negative for abdominal pain.  Musculoskeletal:       Rt dorsal hand pain, wrist pain and proximal forearm pain.    Past Medical History:  Diagnosis Date  . Abnormal uterine bleeding   . Abnormal uterine bleeding   . Anemia   . Fibroids   . PCOS (polycystic ovarian syndrome)      Social History   Socioeconomic History  . Marital status: Married    Spouse name: Not on file  . Number of children: Not on file  . Years of education: Not on file  . Highest education level: Not on file  Occupational History  . Not on file  Tobacco Use  . Smoking status: Never Smoker  . Smokeless tobacco: Never Used  Substance and Sexual Activity  . Alcohol use: No  . Drug use: No  . Sexual activity: Not on file  Other Topics Concern  . Not on file  Social History Narrative  . Not on file   Social Determinants of Health   Financial Resource Strain:   . Difficulty of Paying Living Expenses:   Food Insecurity:   . Worried About Charity fundraiser in the Last Year:   . Arboriculturist in the Last Year:   Transportation Needs:   . Film/video editor  (Medical):   Marland Kitchen Lack of Transportation (Non-Medical):   Physical Activity:   . Days of Exercise per Week:   . Minutes of Exercise per Session:   Stress:   . Feeling of Stress :   Social Connections:   . Frequency of Communication with Friends and Family:   . Frequency of Social Gatherings with Friends and Family:   . Attends Religious Services:   . Active Member of Clubs or Organizations:   . Attends Archivist Meetings:   Marland Kitchen Marital Status:   Intimate Partner Violence:   . Fear of Current or Ex-Partner:   . Emotionally Abused:   Marland Kitchen Physically Abused:   . Sexually Abused:     No past surgical history on file.  No family history on file.  No Known Allergies  Current Outpatient Medications on File Prior to Visit  Medication Sig Dispense Refill  . Cyanocobalamin (VITAMIN B 12 PO) Take 1 tablet daily by mouth.    . danazol (DANOCRINE) 200 MG capsule 200 mg.    . letrozole (FEMARA) 2.5 MG tablet 2.5 mg.    . megestrol (MEGACE) 40 MG tablet TAKE 3 TABLETS AT THE SAME TIME DAILY FOR 5 DAYS,  THEN 2 TABLETS FOR 5 DAYS, THEN 1 TABLET DAILY 45 tablet 3  . naproxen sodium (ALEVE) 220 MG tablet Take 440 mg 2 (two) times daily as needed by mouth.     No current facility-administered medications on file prior to visit.    BP 120/80   Pulse 99   Resp 18   Ht 5\' 1"  (1.549 m)   Wt 222 lb (100.7 kg)   SpO2 99%   BMI 41.95 kg/m       Objective:   Physical Exam  General- No acute distress. Pleasant patient. Neck- Full range of motion, no jvd.no mid cervical pain Lungs- Clear, even and unlabored. Heart- regular rate and rhythm. Neurologic- CNII- XII grossly intact. Rt upper arm- no pain on palpation upper bicep. No swelling.  Rt elbow- no pain on palpation of elbow or flexion and extension. Rt foearm- distal aspect mid tender on palpation. Rt wrist- pain on flexion and extension.  Rt hand- pain on palpation dorsal hand and on movement of fingers. No warmth. Negative  phalens sign.       Assessment & Plan:  For your rt wrist pain and hand pain, I want you to get wrist cock up splint and use daily. Will give you work excuse for 25 th, 79 th and 29 th.   Can use combination of low ibuprofen 400 mg every 8 hour with tylenol 325 mg.   I think you need to rest.  If pain persists by early next week would consider referral to sports med. I think rest will help.  Follow up in 7-10 days or as needed.  Time spent with patient today was 20  minutes which consisted of chart review, discussing diagnosis, work up treatment and documentation.  Mackie Pai, PA-C

## 2019-09-19 NOTE — Patient Instructions (Addendum)
For your rt wrist pain and hand pain, I want you to get wrist cock up splint and use daily. Will give you work excuse for 25 th, 48 th and 4 th.   Can use combination of low ibuprofen 400 mg every 8 hour with tylenol 325 mg.   I think you need to rest.  If pain persists by early next week would consider referral to sports med. I think rest will help.  Follow up in 7-10 days or as needed.

## 2019-09-20 ENCOUNTER — Ambulatory Visit: Payer: No Typology Code available for payment source | Admitting: Medical

## 2019-09-20 MED FILL — DANAZOL 200 MG CAPSULE: 200 | 30 days supply | Qty: 60 | Fill #9

## 2019-09-26 ENCOUNTER — Encounter: Payer: Self-pay | Admitting: Medical

## 2019-09-29 ENCOUNTER — Telehealth: Payer: No Typology Code available for payment source | Admitting: Medical

## 2019-09-29 ENCOUNTER — Other Ambulatory Visit: Payer: Self-pay

## 2019-09-29 ENCOUNTER — Telehealth (INDEPENDENT_AMBULATORY_CARE_PROVIDER_SITE_OTHER): Payer: No Typology Code available for payment source | Admitting: Medical

## 2019-09-29 DIAGNOSIS — M25531 Pain in right wrist: Secondary | ICD-10-CM

## 2019-09-29 NOTE — Progress Notes (Signed)
   Subjective:    Patient ID: Lori Gillespie, female    DOB: 01/13/1991, 29 y.o.   MRN: [id number]  HPI   Virtual Visit via Video Note  I connected with Lori Gillespie on 09/29/19 at  2:40 PM EDT by a video enabled telemedicine application and verified that I am speaking with the correct person using two identifiers.  Location: Patient: home Provider:  office   I discussed the limitations of evaluation and management by telemedicine and the availability of in person appointments. The patient expressed understanding and agreed to proceed.  History of Present Illness:   Pt did miss 3 days of work(I gave her days due to rt wrist area pain, tingling with numbness.). Rest and using wrist cock up splint did help. Pt returned to work and she states symptom of tingling and mild sharp pain occur after her shift only but not at work presently.  Pt uses splint when she gets off from work. Pt used low dose tylenol and ibuprofen and it helped.   Observations/Objective: General-no acute distress, pleasant, oriented. Lungs- on inspection lungs appear unlabored. Neck- no tracheal deviation or jvd on inspection. Neuro- gross motor function appears intact.  Assessment and Plan: Your right wrist pain did improve with 3 days of rest, wrist cock-up splint use and combination of Tylenol/ibuprofen.  Glad to hear that you are now back at work with no symptoms during work.  Have you do report some minimal symptoms at night.  Would recommend that she continue to use wrist cock-up splint during the evening.  I will see if your pain gradually resolves completely.  If it does return while you are at work then would recommend that you use the days allotted on the FMLA form.  If pain persists then can refer you to sports med.  Follow up November 06, 2019 or as needed  [employer], PA-C   .  Time spent with patient today was  25 minutes which consisted of chart review, discussing diagnosis, filling out  fmla form and documentation.  Follow Up Instructions:    I discussed the assessment and treatment plan with the patient. The patient was provided an opportunity to ask questions and all were answered. The patient agreed with the plan and demonstrated an understanding of the instructions.   The patient was advised to call back or seek an in-person evaluation if the symptoms worsen or if the condition fails to improve as anticipated.     Mackie Pai, PA-C  Review of Systems     Objective:   Physical Exam        Assessment & Plan:

## 2019-09-29 NOTE — Patient Instructions (Signed)
Your right wrist pain did improve with 3 days of rest, wrist cock-up splint use and combination of Tylenol/ibuprofen.  Glad to hear that you are now back at work with no symptoms during work.  Have you do report some minimal symptoms at night.  Would recommend that she continue to use wrist cock-up splint during the evening.  I will see if your pain gradually resolves completely.  If it does return while you are at work then would recommend that you use the days allotted on the FMLA form.  If pain persists then can refer you to sports med.  Follow up November 06, 2019 or as needed

## 2019-10-02 ENCOUNTER — Telehealth: Payer: Self-pay

## 2019-10-02 NOTE — Telephone Encounter (Addendum)
Patient called in to check to see if PA Mackie Pai has faxed over her FMLA paper work. Please follow up with the patient and advise at [phone]    Patient called in again to check on the status of her FMLA paper work. 10/03/2019   Per the patient she would like a follow up call as soon as possible at [phone]

## 2019-10-03 ENCOUNTER — Encounter: Payer: Self-pay | Admitting: Medical

## 2019-10-09 ENCOUNTER — Other Ambulatory Visit: Payer: Self-pay

## 2019-10-09 ENCOUNTER — Encounter: Payer: Self-pay | Admitting: Medical

## 2019-10-09 ENCOUNTER — Telehealth (INDEPENDENT_AMBULATORY_CARE_PROVIDER_SITE_OTHER): Payer: No Typology Code available for payment source | Admitting: Medical

## 2019-10-09 DIAGNOSIS — M25531 Pain in right wrist: Secondary | ICD-10-CM | POA: Diagnosis not present

## 2019-10-09 NOTE — Progress Notes (Signed)
   Subjective:    Patient ID: Lori Gillespie, female    DOB: 12-Feb-1991, 29 y.o.   MRN: [id number]  HPI Virtual Visit via Telephone Note  I connected with Lori Gillespie on 10/09/19 at  2:00 PM EDT by telephone and verified that I am speaking with the correct person using two identifiers.   Location: Patient: home Provider: office   I discussed the limitations, risks, security and privacy concerns of performing an evaluation and management service by telephone and the availability of in person appointments. I also discussed with the patient that there may be a patient responsible charge related to this service. The patient expressed understanding and agreed to proceed.   History of Present Illness: Pt missed work on 10-01-2019. This was due to wrist and hand pain rt side. She get numbness and tingling with pain when she works.   I filled out fmla form last time she was in.  This form is reasonable accommodation form. Since her condition is rt upper ext it is difficulty to make or recommend accommodation other than being off periodically to let her symptoms ease as rt hand critical in her duties.     Observations/Objective: General-no acute distress, normal speech. Alert and oriented.  Assessment and Plan:   I did fill out reason for accomodation request. Discussed specifics of form with pt today.  Explained on form difficult to make accomodation since on days of flare she would not be able to use rt hand/dominant hand.  Can continue with wrist cock up splint. Use tylenol and ibuprofen.   If pain continue refer to sports med.  Follow up one month or as needed  Follow Up Instructions:    I discussed the assessment and treatment plan with the patient. The patient was provided an opportunity to ask questions and all were answered. The patient agreed with the plan and demonstrated an understanding of the instructions.   The patient was advised to call back or seek an  in-person evaluation if the symptoms worsen or if the condition fails to improve as anticipated.     Mackie Pai, PA-C  Time spent with patient today was 20  minutes which consisted of chart review, discussing diagnosis,  Treatment, accomadation and documentation.  Review of Systems     Objective:   Physical Exam        Assessment & Plan:

## 2019-10-09 NOTE — Patient Instructions (Addendum)
I did fill out reason for accomodation request. Discussed specifics of form with pt today.  Explained on form difficult to make accomadation since on days of flare she would not be able to use rt hand/dominant hand.  Can continue with wrist cock up splint. Use tylenol and ibuprofen.   If pain continue refer to sports med.  Follow up one month or as needed

## 2019-10-18 MED FILL — DANAZOL 200 MG CAPSULE: 200 | 30 days supply | Qty: 60 | Fill #10

## 2019-10-18 MED FILL — LETROZOLE 2.5 MG TABLET: 2.5 | 30 days supply | Qty: 30 | Fill #11

## 2019-11-06 ENCOUNTER — Ambulatory Visit: Payer: No Typology Code available for payment source | Admitting: Medical

## 2019-11-07 ENCOUNTER — Ambulatory Visit: Payer: No Typology Code available for payment source | Admitting: Medical

## 2019-11-10 ENCOUNTER — Ambulatory Visit: Payer: No Typology Code available for payment source | Admitting: Medical

## 2019-11-16 ENCOUNTER — Ambulatory Visit: Payer: No Typology Code available for payment source | Admitting: Medical

## 2019-11-22 MED FILL — LETROZOLE 2.5 MG TABLET: 2.5 | 30 days supply | Qty: 30 | Fill #0

## 2019-11-22 MED FILL — DANAZOL 200 MG CAPSULE: 200 | 30 days supply | Qty: 60 | Fill #0

## 2019-12-22 ENCOUNTER — Other Ambulatory Visit: Payer: Self-pay | Admitting: Obstetrics and Gynecology

## 2019-12-25 MED FILL — DANAZOL 200 MG CAPSULE: 200 | 30 days supply | Qty: 60 | Fill #1

## 2019-12-25 MED FILL — LETROZOLE 2.5 MG TABLET: 2.5 | 30 days supply | Qty: 30 | Fill #1

## 2020-01-29 ENCOUNTER — Other Ambulatory Visit (HOSPITAL_COMMUNITY): Payer: Self-pay | Admitting: Obstetrics and Gynecology

## 2020-01-29 MED FILL — LETROZOLE 2.5 MG TABLET: 2.5 | 30 days supply | Qty: 30 | Fill #0

## 2020-01-29 MED FILL — DANAZOL 200 MG CAPSULE: 200 | 30 days supply | Qty: 60 | Fill #0

## 2020-01-31 ENCOUNTER — Ambulatory Visit: Payer: No Typology Code available for payment source | Admitting: Medical

## 2020-02-29 ENCOUNTER — Other Ambulatory Visit: Payer: Self-pay

## 2020-02-29 ENCOUNTER — Ambulatory Visit (INDEPENDENT_AMBULATORY_CARE_PROVIDER_SITE_OTHER): Payer: No Typology Code available for payment source | Admitting: Medical

## 2020-02-29 VITALS — BP 130/80 | HR 76 | Resp 18 | Ht 61.0 in | Wt 223.0 lb

## 2020-02-29 DIAGNOSIS — G5601 Carpal tunnel syndrome, right upper limb: Secondary | ICD-10-CM | POA: Diagnosis not present

## 2020-02-29 DIAGNOSIS — R739 Hyperglycemia, unspecified: Secondary | ICD-10-CM | POA: Diagnosis not present

## 2020-02-29 DIAGNOSIS — D219 Benign neoplasm of connective and other soft tissue, unspecified: Secondary | ICD-10-CM | POA: Diagnosis not present

## 2020-02-29 NOTE — Progress Notes (Signed)
Subjective:    Patient ID: Lori Gillespie, female    DOB: 11-08-1990, 29 y.o.   MRN: [id number]  HPI   Pt in for rt upper extremity wrist/hand area swelling and tingling. Pt states she recently missed work. She has active fmla paperwork due to probable carpel tunnel syndrome symptoms. Pt she uses alleve when symptoms flare and wrist cock up splint.  Pt also having surgery upcoming to remove fibroids in 4 months.. She gained weight with danazol and letrozole. Her fertility specialist want her to loose weight.  Pt obese. On review not diabetic. But hx of elevated blood sugar.       Review of Systems  Constitutional: Negative for chills and fever.  Respiratory: Negative for cough, chest tightness and wheezing.   Cardiovascular: Negative for chest pain and palpitations.  Gastrointestinal: Negative for abdominal pain.  Musculoskeletal:       See hpi.  Skin: Negative for rash.  Neurological: Negative for dizziness, numbness and headaches.  Hematological: Negative for adenopathy. Does not bruise/bleed easily.  Psychiatric/Behavioral: Negative for behavioral problems and confusion.    Past Medical History:  Diagnosis Date  . Abnormal uterine bleeding   . Abnormal uterine bleeding   . Anemia   . Fibroids   . PCOS (polycystic ovarian syndrome)      Social History   Socioeconomic History  . Marital status: Married    Spouse name: Not on file  . Number of children: Not on file  . Years of education: Not on file  . Highest education level: Not on file  Occupational History  . Not on file  Tobacco Use  . Smoking status: Never Smoker  . Smokeless tobacco: Never Used  Substance and Sexual Activity  . Alcohol use: No  . Drug use: No  . Sexual activity: Not on file  Other Topics Concern  . Not on file  Social History Narrative  . Not on file   Social Determinants of Health   Financial Resource Strain:   . Difficulty of Paying Living Expenses: Not on file  Food  Insecurity:   . Worried About Charity fundraiser in the Last Year: Not on file  . Ran Out of Food in the Last Year: Not on file  Transportation Needs:   . Lack of Transportation (Medical): Not on file  . Lack of Transportation (Non-Medical): Not on file  Physical Activity:   . Days of Exercise per Week: Not on file  . Minutes of Exercise per Session: Not on file  Stress:   . Feeling of Stress : Not on file  Social Connections:   . Frequency of Communication with Friends and Family: Not on file  . Frequency of Social Gatherings with Friends and Family: Not on file  . Attends Religious Services: Not on file  . Active Member of Clubs or Organizations: Not on file  . Attends Archivist Meetings: Not on file  . Marital Status: Not on file  Intimate Partner Violence:   . Fear of Current or Ex-Partner: Not on file  . Emotionally Abused: Not on file  . Physically Abused: Not on file  . Sexually Abused: Not on file    No past surgical history on file.  No family history on file.  No Known Allergies  Current Outpatient Medications on File Prior to Visit  Medication Sig Dispense Refill  . Cyanocobalamin (VITAMIN B 12 PO) Take 1 tablet daily by mouth.    . danazol (DANOCRINE) 200 MG  capsule 200 mg.    . letrozole (FEMARA) 2.5 MG tablet 2.5 mg.    . megestrol (MEGACE) 40 MG tablet TAKE 3 TABLETS AT THE SAME TIME DAILY FOR 5 DAYS, THEN 2 TABLETS FOR 5 DAYS, THEN 1 TABLET DAILY 45 tablet 3  . naproxen sodium (ALEVE) 220 MG tablet Take 440 mg 2 (two) times daily as needed by mouth.     No current facility-administered medications on file prior to visit.    BP 130/80 Comment: left arm checked twice ESPAC.  Pulse 76   Resp 18   Ht 5\' 1"  (1.549 m)   Wt 223 lb (101.2 kg)   SpO2 99%   BMI 42.14 kg/m       Objective:   Physical Exam  General Mental Status- Alert. General Appearance- Not in acute distress.   Skin General: Color- Normal Color. Moisture- Normal  Moisture.  Neck Carotid Arteries- Normal color. Moisture- Normal Moisture. No carotid bruits. No JVD.  Chest and Lung Exam Auscultation: Breath Sounds:-Normal.  Cardiovascular Auscultation:Rythm- Regular. Murmurs & Other Heart Sounds:Auscultation of the heart reveals- No Murmurs.  Abdomen Inspection:-Inspeection Normal. Palpation/Percussion:Note:No mass. Palpation and Percussion of the abdomen reveal- Non Tender, Non Distended + BS, no rebound or guarding.    Neurologic Cranial Nerve exam:- CN III-XII intact(No nystagmus), symmetric smile. tStrength:- 5/5 equal and symmetric strength both upper and lower extremities.  Rt upper ext- easily induced phalen sign.       Assessment & Plan:  For cts filled out work [employer] form. Continue alleve and wrist cock up splint. Use alleve if needed and refer to sports med MD.  For obesity refer Weight loss management.  For elevated sugar cmp and a1c.  Follow up date to be determined after lab review.  Mackie Pai, PA-C   Time spent with patient today was  30+ minutes which consisted of chart revdiew, discussing diagnosis, work up treatment and documentation.

## 2020-02-29 NOTE — Patient Instructions (Addendum)
For cts filled out work [employer] form. Continue alleve and wrist cock up splint. Use alleve if needed and refer to sports med MD.  For obesity refer Weight loss management.  For elevated sugar cmp and a1c.  Follow up date to be determined after lab review.

## 2020-03-01 LAB — COMPREHENSIVE METABOLIC PANEL
AG Ratio: 1.5 (calc) (ref 1.0–2.5)
ALT: 47 U/L — ABNORMAL HIGH (ref 6–29)
AST: 25 U/L (ref 10–30)
Albumin: 4.4 g/dL (ref 3.6–5.1)
Alkaline phosphatase (APISO): 78 U/L (ref 31–125)
BUN: 9 mg/dL (ref 7–25)
CO2: 24 mmol/L (ref 20–32)
Calcium: 9.9 mg/dL (ref 8.6–10.2)
Chloride: 102 mmol/L (ref 98–110)
Creat: 0.56 mg/dL (ref 0.50–1.10)
Globulin: 3 g/dL (calc) (ref 1.9–3.7)
Glucose, Bld: 85 mg/dL (ref 65–99)
Potassium: 4.5 mmol/L (ref 3.5–5.3)
Sodium: 138 mmol/L (ref 135–146)
Total Bilirubin: 0.6 mg/dL (ref 0.2–1.2)
Total Protein: 7.4 g/dL (ref 6.1–8.1)

## 2020-03-01 LAB — HEMOGLOBIN A1C
Hgb A1c MFr Bld: 6.6 % of total Hgb — ABNORMAL HIGH (ref <5.7)
Mean Plasma Glucose: 143 (calc)
eAG (mmol/L): 7.9 (calc)

## 2020-03-03 ENCOUNTER — Telehealth: Payer: Self-pay | Admitting: Medical

## 2020-03-03 MED ORDER — METFORMIN HCL 500 MG PO TABS: 500.0000 mg | ORAL_TABLET | Freq: Two times a day (BID) | ORAL | 3 refills | Status: DC

## 2020-03-03 NOTE — Telephone Encounter (Signed)
Rx metformin sent to pt pharmacy. 

## 2020-03-04 ENCOUNTER — Telehealth: Payer: Self-pay

## 2020-03-04 MED FILL — LETROZOLE 2.5 MG TABLET: 2.5 | 30 days supply | Qty: 30 | Fill #1

## 2020-03-04 MED FILL — DANAZOL 200 MG CAPSULE: 200 | 30 days supply | Qty: 60 | Fill #1

## 2020-03-04 NOTE — Telephone Encounter (Signed)
Patient called stating the new script for Metformin along with all of her future prescriptions need to be sent to [facility].  Please resend Metformin to [facility] per patient request.

## 2020-03-05 ENCOUNTER — Other Ambulatory Visit: Payer: Self-pay | Admitting: Medical

## 2020-03-05 MED ORDER — METFORMIN HCL 500 MG PO TABS: 500.0000 mg | ORAL_TABLET | Freq: Two times a day (BID) | ORAL | 3 refills | Status: DC

## 2020-03-05 MED FILL — METFORMIN HCL 500 MG TABS: 500 | 90 days supply | Qty: 180 | Fill #0

## 2020-03-05 NOTE — Telephone Encounter (Signed)
Metformin sent.

## 2020-03-29 ENCOUNTER — Encounter: Payer: Self-pay | Admitting: Medical

## 2020-03-29 ENCOUNTER — Ambulatory Visit (INDEPENDENT_AMBULATORY_CARE_PROVIDER_SITE_OTHER): Payer: No Typology Code available for payment source | Admitting: Medical

## 2020-03-29 ENCOUNTER — Other Ambulatory Visit: Payer: Self-pay

## 2020-03-29 VITALS — BP 124/88 | HR 98 | Temp 98.1°F | Resp 18 | Ht 61.0 in | Wt 223.4 lb

## 2020-03-29 DIAGNOSIS — G5601 Carpal tunnel syndrome, right upper limb: Secondary | ICD-10-CM

## 2020-03-29 DIAGNOSIS — M25531 Pain in right wrist: Secondary | ICD-10-CM

## 2020-03-29 NOTE — Patient Instructions (Addendum)
For persisting severe recurrent CTS will fill out fmla paperwork again. Keep the sport medicine appointment next week.  Continue wrist splint.   Send me copy of blank fmla form and your copy of form that I filled out for you last time. Will fill out the same.  Make appointment virtual on Monday at 340 in event need to clarify questions on form.

## 2020-03-29 NOTE — Progress Notes (Signed)
Subjective:    Patient ID: Lori Gillespie, female    DOB: [birth year], 29 y.o.   MRN: [id number]  HPI   Pt in for follow up.  Pt states that she had asked for me to fill out wrong form last time.  My medical assistant who has files of paperwork not here. Covering MA can't find paperwork.   I had filled out fmla forms in the past.  Pt states she needs renewal of her fmla for the Carpel tunnel syndrome type symptoms of rt upper ext.  Last hpi   Pt in for rt upper extremity wrist/hand area swelling and tingling. Pt states she recently missed work. She has active fmla paperwork due to probable carpel tunnel syndrome symptoms. Pt she uses alleve when symptoms flare and wrist cock up splint.    Review of Systems  Constitutional: Negative for chills, fatigue and fever.  Respiratory: Negative for cough, chest tightness, shortness of breath and wheezing.   Cardiovascular: Negative for chest pain and palpitations.  Gastrointestinal: Negative for abdominal pain.  Musculoskeletal:       See hpi.    Past Medical History:  Diagnosis Date  . Abnormal uterine bleeding   . Abnormal uterine bleeding   . Anemia   . Fibroids   . PCOS (polycystic ovarian syndrome)      Social History   Socioeconomic History  . Marital status: Married    Spouse name: Not on file  . Number of children: Not on file  . Years of education: Not on file  . Highest education level: Not on file  Occupational History  . Not on file  Tobacco Use  . Smoking status: Never Smoker  . Smokeless tobacco: Never Used  Substance and Sexual Activity  . Alcohol use: No  . Drug use: No  . Sexual activity: Not on file  Other Topics Concern  . Not on file  Social History Narrative  . Not on file   Social Determinants of Health   Financial Resource Strain:   . Difficulty of Paying Living Expenses: Not on file  Food Insecurity:   . Worried About Charity fundraiser in the Last Year: Not on file  . Ran Out of  Food in the Last Year: Not on file  Transportation Needs:   . Lack of Transportation (Medical): Not on file  . Lack of Transportation (Non-Medical): Not on file  Physical Activity:   . Days of Exercise per Week: Not on file  . Minutes of Exercise per Session: Not on file  Stress:   . Feeling of Stress : Not on file  Social Connections:   . Frequency of Communication with Friends and Family: Not on file  . Frequency of Social Gatherings with Friends and Family: Not on file  . Attends Religious Services: Not on file  . Active Member of Clubs or Organizations: Not on file  . Attends Archivist Meetings: Not on file  . Marital Status: Not on file  Intimate Partner Violence:   . Fear of Current or Ex-Partner: Not on file  . Emotionally Abused: Not on file  . Physically Abused: Not on file  . Sexually Abused: Not on file    No past surgical history on file.  No family history on file.  No Known Allergies  Current Outpatient Medications on File Prior to Visit  Medication Sig Dispense Refill  . Cyanocobalamin (VITAMIN B 12 PO) Take 1 tablet daily by mouth.    Marland Kitchen  danazol (DANOCRINE) 200 MG capsule 200 mg.    . letrozole (FEMARA) 2.5 MG tablet 2.5 mg.    . metFORMIN (GLUCOPHAGE) 500 MG tablet Take 1 tablet (500 mg total) by mouth 2 (two) times daily with a meal. 180 tablet 3  . naproxen sodium (ALEVE) 220 MG tablet Take 440 mg 2 (two) times daily as needed by mouth.    . megestrol (MEGACE) 40 MG tablet TAKE 3 TABLETS AT THE SAME TIME DAILY FOR 5 DAYS, THEN 2 TABLETS FOR 5 DAYS, THEN 1 TABLET DAILY (Patient not taking: Reported on 03/29/2020) 45 tablet 3   No current facility-administered medications on file prior to visit.    BP 124/88 (BP Location: Left Arm, Patient Position: Sitting, Cuff Size: Large)   Pulse 98   Temp 98.1 F (36.7 C) (Oral)   Resp 18   Ht 5\' 1"  (1.549 m)   Wt 223 lb 6.4 oz (101.3 kg)   SpO2 96%   BMI 42.21 kg/m   Objective:   Physical  Exam General Mental Status- Alert. General Appearance- Not in acute distress.     Chest and Lung Exam Auscultation: Breath Sounds:-Normal.  Cardiovascular Auscultation:Rythm- Regular. Murmurs & Other Heart Sounds:Auscultation of the heart reveals- No Murmurs.    Neurologic Cranial Nerve exam:- CN III-XII intact(No nystagmus), symmetric smile. tStrength:- 5/5 equal and symmetric strength both upper and lower extremities.  Rt upper ext- easily induced phalen sign.       Assessment & Plan:  For persisting severe recurrent CTS will fill out fmla paperwork again. Keep the sport medicine appointment next week.  Continue wrist splint.   Send me copy of blank fmla form and your copy of form that I filled out for you last time. Will fill out the same.  Make appointment virtual on Monday at 340 in event need to clarify questions on form.   Mackie Pai, PA-C

## 2020-04-01 ENCOUNTER — Encounter: Payer: Self-pay | Admitting: Medical

## 2020-04-01 ENCOUNTER — Ambulatory Visit (INDEPENDENT_AMBULATORY_CARE_PROVIDER_SITE_OTHER): Payer: No Typology Code available for payment source | Admitting: Medical

## 2020-04-01 ENCOUNTER — Other Ambulatory Visit: Payer: Self-pay

## 2020-04-01 ENCOUNTER — Encounter (HOSPITAL_COMMUNITY): Admission: RE | Admit: 2020-04-01 | Payer: No Typology Code available for payment source | Source: Ambulatory Visit

## 2020-04-01 NOTE — Progress Notes (Signed)
   Subjective:    Patient ID: Lori Gillespie, female    DOB: 02-03-1991, 29 y.o.   MRN: [id number]  HPI Filled out pt fmla paperwork for her own condition. Provided with wrong forms. I marked out her husband name and explained employee own health condition.  Continuation of last visit. She did not have forms.  Review of Systems     Objective:   Physical Exam        Assessment & Plan:

## 2020-04-03 ENCOUNTER — Ambulatory Visit (INDEPENDENT_AMBULATORY_CARE_PROVIDER_SITE_OTHER): Payer: No Typology Code available for payment source | Admitting: Medical

## 2020-04-03 ENCOUNTER — Other Ambulatory Visit: Payer: Self-pay

## 2020-04-03 ENCOUNTER — Ambulatory Visit: Payer: No Typology Code available for payment source | Admitting: Medical

## 2020-04-03 VITALS — BP 123/87 | HR 88 | Temp 97.9°F | Resp 18 | Ht 60.0 in | Wt 221.0 lb

## 2020-04-03 DIAGNOSIS — G5601 Carpal tunnel syndrome, right upper limb: Secondary | ICD-10-CM

## 2020-04-03 DIAGNOSIS — M25531 Pain in right wrist: Secondary | ICD-10-CM | POA: Diagnosis not present

## 2020-04-03 NOTE — Progress Notes (Signed)
   Subjective:    Patient ID: Lori Gillespie, female    DOB: 11/23/1990, 29 y.o.   MRN: [id number]  HPI  Appointment is for right upper extremity carpal tunnel syndrome type symptoms.  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  Since her dominant hand is affected this makes it very difficult for her to work when she has flares.  2 days ago I filled out paperwork but it was on the wrong form.  So she needs the form filled out again.  She brings in the correct form today.    Review of Systems     Objective:   Physical Exam   General-no acute distress, pleasant. Right upper extremity-she has a brace which she using presently.     Assessment & Plan:  Right wrist pain/carpal tunnel syndrome-like.  Filled out paperwork today.  Advised continue to use splint and use either ibuprofen or Aleve over-the-counter.  Sports medicine referral pending.  Follow-up as needed after sports medicine appointment.

## 2020-04-03 NOTE — Patient Instructions (Signed)
Right wrist pain/carpal tunnel syndrome-like.  Filled out paperwork today.  Advised continue to use splint and use either ibuprofen or Aleve over-the-counter.  Sports medicine referral pending.  Follow-up as needed after sports medicine appointment.

## 2020-04-10 MED FILL — LETROZOLE 2.5 MG TABLET: 2.5 | 30 days supply | Qty: 30 | Fill #2

## 2020-05-24 ENCOUNTER — Encounter: Payer: Self-pay | Admitting: Medical

## 2020-05-25 ENCOUNTER — Telehealth: Payer: Self-pay | Admitting: Medical

## 2020-05-25 MED ORDER — GABAPENTIN 100 MG PO CAPS: 100.0000 mg | ORAL_CAPSULE | Freq: Three times a day (TID) | ORAL | 0 refills | Status: DC

## 2020-05-25 NOTE — Telephone Encounter (Signed)
Rx gabapentin sent to pt pharmacy. 

## 2020-05-26 ENCOUNTER — Encounter (INDEPENDENT_AMBULATORY_CARE_PROVIDER_SITE_OTHER): Payer: Self-pay

## 2020-06-08 MED FILL — LETROZOLE 2.5 MG TABLET: 2.5 | 30 days supply | Qty: 30 | Fill #3

## 2020-06-08 MED FILL — DANAZOL 200 MG CAPSULE: 200 | 30 days supply | Qty: 60 | Fill #2

## 2020-06-14 ENCOUNTER — Telehealth (INDEPENDENT_AMBULATORY_CARE_PROVIDER_SITE_OTHER): Payer: No Typology Code available for payment source | Admitting: Family Medicine

## 2020-06-14 ENCOUNTER — Other Ambulatory Visit: Payer: Self-pay

## 2020-06-14 ENCOUNTER — Telehealth: Payer: No Typology Code available for payment source | Admitting: Family Medicine

## 2020-06-14 DIAGNOSIS — Z5329 Procedure and treatment not carried out because of patient's decision for other reasons: Secondary | ICD-10-CM

## 2020-06-15 NOTE — Progress Notes (Signed)
Pt cancelled within 2 hours of her appointment. Will be assessed no show fee.

## 2020-06-19 ENCOUNTER — Telehealth: Payer: No Typology Code available for payment source | Admitting: Medical

## 2020-06-19 ENCOUNTER — Other Ambulatory Visit (HOSPITAL_COMMUNITY): Payer: Self-pay | Admitting: Urgent Care

## 2020-06-19 ENCOUNTER — Encounter (HOSPITAL_COMMUNITY): Payer: Self-pay | Admitting: *Deleted

## 2020-06-19 ENCOUNTER — Ambulatory Visit (HOSPITAL_COMMUNITY)
Admission: EM | Admit: 2020-06-19 | Discharge: 2020-06-19 | Disposition: A | Discharge status: Home / Self Care | Payer: No Typology Code available for payment source | Attending: Family Medicine | Admitting: Family Medicine

## 2020-06-19 ENCOUNTER — Other Ambulatory Visit: Payer: Self-pay

## 2020-06-19 ENCOUNTER — Ambulatory Visit (INDEPENDENT_AMBULATORY_CARE_PROVIDER_SITE_OTHER): Payer: No Typology Code available for payment source

## 2020-06-19 DIAGNOSIS — R079 Chest pain, unspecified: Secondary | ICD-10-CM

## 2020-06-19 DIAGNOSIS — R0602 Shortness of breath: Secondary | ICD-10-CM

## 2020-06-19 DIAGNOSIS — U071 COVID-19: Secondary | ICD-10-CM

## 2020-06-19 DIAGNOSIS — Z8709 Personal history of other diseases of the respiratory system: Secondary | ICD-10-CM

## 2020-06-19 DIAGNOSIS — J3489 Other specified disorders of nose and nasal sinuses: Secondary | ICD-10-CM

## 2020-06-19 DIAGNOSIS — R059 Cough, unspecified: Secondary | ICD-10-CM

## 2020-06-19 MED ORDER — PREDNISONE 20 MG PO TABS: ORAL_TABLET | ORAL | 0 refills | Status: DC

## 2020-06-19 MED ORDER — ALBUTEROL SULFATE HFA 108 (90 BASE) MCG/ACT IN AERS: 1.0000 | INHALATION_SPRAY | Freq: Four times a day (QID) | RESPIRATORY_TRACT | 0 refills | Status: DC | PRN

## 2020-06-19 MED ORDER — CETIRIZINE HCL 10 MG PO TABS: 10.0000 mg | ORAL_TABLET | Freq: Every day | ORAL | 0 refills | Status: DC

## 2020-06-19 MED FILL — ALBUTEROL SULFATE HFA 108 (: 108 (90 BAS | 16 days supply | Qty: 18 | Fill #0

## 2020-06-19 MED FILL — predniSONE 20 MG TABS: 20 | 5 days supply | Qty: 10 | Fill #0

## 2020-06-19 NOTE — ED Provider Notes (Signed)
[facility]   MRN: [id number] DOB: 08-21-1990  Subjective:   Lori Gillespie is a 30 y.o. female presenting for ongoing chest tightness, shortness of breath, cough, sinus congestion and postnasal drainage since being diagnosed with COVID-19 last week.  Patient was tested on Thursday through health at work.  She has a history of asthma as a child.  Has not needed an inhaler in her adulthood.  Denies any fevers, body aches, chest pain.  No current facility-administered medications for this encounter.  Current Outpatient Medications:  .  Cyanocobalamin (VITAMIN B 12 PO), Take 1 tablet daily by mouth., Disp: , Rfl:  .  danazol (DANOCRINE) 200 MG capsule, 200 mg., Disp: , Rfl:  .  gabapentin (NEURONTIN) 100 MG capsule, Take 1 capsule (100 mg total) by mouth 3 (three) times daily., Disp: 90 capsule, Rfl: 0 .  letrozole (FEMARA) 2.5 MG tablet, 2.5 mg., Disp: , Rfl:  .  metFORMIN (GLUCOPHAGE) 500 MG tablet, Take 1 tablet (500 mg total) by mouth 2 (two) times daily with a meal., Disp: 180 tablet, Rfl: 3 .  naproxen sodium (ALEVE) 220 MG tablet, Take 440 mg 2 (two) times daily as needed by mouth., Disp: , Rfl:    No Known Allergies  Past Medical History:  Diagnosis Date  . Abnormal uterine bleeding   . Abnormal uterine bleeding   . Anemia   . Fibroids   . PCOS (polycystic ovarian syndrome)      History reviewed. No pertinent surgical history.  History reviewed. No pertinent family history.  Social History   Tobacco Use  . Smoking status: Never Smoker  . Smokeless tobacco: Never Used  Substance Use Topics  . Alcohol use: No  . Drug use: No    ROS   Objective:   Vitals: BP 126/84 (BP Location: Right Arm)   Pulse 84   Temp 98 F (36.7 C) (Oral)   Resp 18   SpO2 96%   Physical Exam Constitutional:      General: She is not in acute distress.    Appearance: Normal appearance. She is well-developed. She is not ill-appearing, toxic-appearing or  diaphoretic.  HENT:     Head: Normocephalic and atraumatic.     Nose: Nose normal.     Mouth/Throat:     Mouth: Mucous membranes are moist.  Eyes:     Extraocular Movements: Extraocular movements intact.     Pupils: Pupils are equal, round, and reactive to light.  Cardiovascular:     Rate and Rhythm: Normal rate and regular rhythm.     Pulses: Normal pulses.     Heart sounds: Normal heart sounds. No murmur heard. No friction rub. No gallop.   Pulmonary:     Effort: Pulmonary effort is normal. No respiratory distress.     Breath sounds: Normal breath sounds. No stridor. No wheezing, rhonchi or rales.  Skin:    General: Skin is warm and dry.     Findings: No rash.  Neurological:     Mental Status: She is alert and oriented to person, place, and time.  Psychiatric:        Mood and Affect: Mood normal.        Behavior: Behavior normal.        Thought Content: Thought content normal.     DG Chest 2 View  Result Date: 06/19/2020 CLINICAL DATA:  Cough, shortness of breath.  Positive COVID 19. EXAM: CHEST - 2 VIEW COMPARISON:  March 05, 2017. FINDINGS:  The heart size and mediastinal contours are within normal limits. Both lungs are clear. No pneumothorax or pleural effusion is noted. The visualized skeletal structures are unremarkable. IMPRESSION: No active cardiopulmonary disease. Electronically Signed   By: Marijo Conception M.D.   On: 06/19/2020 14:35    Assessment and Plan :   PDMP not reviewed this encounter.  1. COVID   2. Stuffy and runny nose   3. Cough   4. Shortness of breath   5. History of asthma     Given her history, will use a prednisone course, albuterol inhaler and general supportive care. Counseled patient on potential for adverse effects with medications prescribed/recommended today, ER and return-to-clinic precautions discussed, patient verbalized understanding.    Jaynee Eagles, [place] 06/19/20 1445

## 2020-06-19 NOTE — ED Triage Notes (Signed)
Pt reports  A Positive COVID test last week.  Pt   still reports SHOB &,Cough . Pt is requesting a chest x-ray.

## 2020-07-08 ENCOUNTER — Inpatient Hospital Stay (HOSPITAL_COMMUNITY): Admission: RE | Admit: 2020-07-08 | Payer: No Typology Code available for payment source | Source: Ambulatory Visit

## 2020-07-25 MED FILL — LETROZOLE 2.5 MG TABLET: 2.5 | 30 days supply | Qty: 30 | Fill #4

## 2020-07-29 ENCOUNTER — Ambulatory Visit: Payer: Self-pay

## 2020-07-29 ENCOUNTER — Other Ambulatory Visit: Payer: Self-pay

## 2020-07-29 ENCOUNTER — Ambulatory Visit (INDEPENDENT_AMBULATORY_CARE_PROVIDER_SITE_OTHER): Payer: No Typology Code available for payment source | Admitting: Family Medicine

## 2020-07-29 ENCOUNTER — Other Ambulatory Visit (HOSPITAL_COMMUNITY): Payer: Self-pay

## 2020-07-29 ENCOUNTER — Encounter: Payer: Self-pay | Admitting: Family Medicine

## 2020-07-29 VITALS — BP 130/90 | Ht 60.0 in | Wt 221.0 lb

## 2020-07-29 DIAGNOSIS — M25531 Pain in right wrist: Secondary | ICD-10-CM

## 2020-07-29 DIAGNOSIS — G5601 Carpal tunnel syndrome, right upper limb: Secondary | ICD-10-CM

## 2020-07-29 DIAGNOSIS — M25532 Pain in left wrist: Secondary | ICD-10-CM

## 2020-07-29 MED ORDER — PREDNISONE 5 MG PO TABS
ORAL_TABLET | ORAL | 0 refills | Status: DC
  Filled 2020-07-29: qty 21, 6d supply, fill #0

## 2020-07-29 NOTE — Patient Instructions (Signed)
Nice to meet you Please try the brace. Please use it at night.  Please try the medicine   Please send me a message in MyChart with any questions or updates.  Please see me back in 4 weeks.   --Dr. Raeford Razor

## 2020-07-29 NOTE — Progress Notes (Signed)
  Lori Gillespie - 30 y.o. female MRN [id number]  Date of birth: [birth year]  SUBJECTIVE:  Including CC & ROS.  No chief complaint on file.   Lori Gillespie is a 30 y.o. female that is presenting with altered sensation in the right hand as well as her left hand.  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  Her symptoms are on the palmar aspect and seem to be getting worse here recently.   Review of Systems See HPI   HISTORY: Past Medical, Surgical, Social, and Family History Reviewed & Updated per EMR.   Pertinent Historical Findings include:  Past Medical History:  Diagnosis Date  . Abnormal uterine bleeding   . Abnormal uterine bleeding   . Anemia   . Fibroids   . PCOS (polycystic ovarian syndrome)     No past surgical history on file.  No family history on file.  Social History   Socioeconomic History  . Marital status: Married    Spouse name: Not on file  . Number of children: Not on file  . Years of education: Not on file  . Highest education level: Not on file  Occupational History  . Not on file  Tobacco Use  . Smoking status: Never Smoker  . Smokeless tobacco: Never Used  Substance and Sexual Activity  . Alcohol use: No  . Drug use: No  . Sexual activity: Not on file  Other Topics Concern  . Not on file  Social History Narrative  . Not on file   Social Determinants of Health   Financial Resource Strain: Not on file  Food Insecurity: Not on file  Transportation Needs: Not on file  Physical Activity: Not on file  Stress: Not on file  Social Connections: Not on file  Intimate Partner Violence: Not on file     PHYSICAL EXAM:  VS: BP 130/90 (BP Location: Left Arm, Patient Position: Sitting, Cuff Size: Large)   Ht 5' (1.524 m)   Wt 221 lb (100.2 kg)   BMI 43.16 kg/m  Physical Exam Gen: NAD, alert, cooperative with exam, well-appearing MSK:  Right and left hand: No signs of atrophy. Normal strength resistance. Positive  Tinel's in the right wrist. Neurovascular intact  Limited ultrasound: Right wrist, left wrist:  Right wrist: Median nerve was measured to be twice the size is normal. Compression was observed in long axis of the median nerve.  Left wrist: Median nerve was measured to be normal.  Summary: Findings would suggest more severe carpal tunnel syndrome on the right.  Ultrasound and interpretation by Clearance Coots, MD    ASSESSMENT & PLAN:   Carpal tunnel syndrome of right wrist Has findings on the right side suggestive of more severe carpal tunnel -Counseled on home exercise therapy and supportive care. -Wrist brace. -Prednisone. -Could consider injection or physical therapy.

## 2020-07-30 DIAGNOSIS — G5601 Carpal tunnel syndrome, right upper limb: Secondary | ICD-10-CM | POA: Insufficient documentation

## 2020-07-30 DIAGNOSIS — G5603 Carpal tunnel syndrome, bilateral upper limbs: Secondary | ICD-10-CM | POA: Insufficient documentation

## 2020-07-30 NOTE — Assessment & Plan Note (Signed)
Has findings on the right side suggestive of more severe carpal tunnel -Counseled on home exercise therapy and supportive care. -Wrist brace. -Prednisone. -Could consider injection or physical therapy.

## 2020-08-02 ENCOUNTER — Encounter: Payer: Self-pay | Admitting: Family Medicine

## 2020-08-15 ENCOUNTER — Ambulatory Visit: Payer: No Typology Code available for payment source | Admitting: Medical

## 2020-08-16 ENCOUNTER — Encounter: Payer: Self-pay | Admitting: Medical

## 2020-08-16 ENCOUNTER — Ambulatory Visit: Payer: No Typology Code available for payment source | Admitting: Medical

## 2020-08-20 ENCOUNTER — Encounter: Payer: Self-pay | Admitting: Medical

## 2020-08-21 ENCOUNTER — Other Ambulatory Visit: Payer: Self-pay

## 2020-08-21 ENCOUNTER — Other Ambulatory Visit (HOSPITAL_COMMUNITY): Payer: Self-pay

## 2020-08-21 ENCOUNTER — Ambulatory Visit (INDEPENDENT_AMBULATORY_CARE_PROVIDER_SITE_OTHER): Payer: No Typology Code available for payment source | Admitting: Medical

## 2020-08-21 ENCOUNTER — Ambulatory Visit: Payer: No Typology Code available for payment source | Admitting: Medical

## 2020-08-21 ENCOUNTER — Ambulatory Visit: Payer: No Typology Code available for payment source | Admitting: Family Medicine

## 2020-08-21 VITALS — BP 147/100 | HR 90 | Resp 20 | Ht 61.0 in | Wt 230.8 lb

## 2020-08-21 DIAGNOSIS — R739 Hyperglycemia, unspecified: Secondary | ICD-10-CM

## 2020-08-21 DIAGNOSIS — E118 Type 2 diabetes mellitus with unspecified complications: Secondary | ICD-10-CM

## 2020-08-21 DIAGNOSIS — G5601 Carpal tunnel syndrome, right upper limb: Secondary | ICD-10-CM

## 2020-08-21 MED FILL — Letrozole Tab 2.5 MG: ORAL | 30 days supply | Qty: 30 | Fill #0 | Status: CN

## 2020-08-21 MED FILL — Danazol Cap 200 MG: ORAL | 30 days supply | Qty: 60 | Fill #0 | Status: CN

## 2020-08-21 NOTE — Patient Instructions (Addendum)
Persisting CTS symptoms. Follow sports med guidelines/treatment. Wrist cock up splint and prednisone.  Filled out fmla form today. Staff will fax.  See Dr. Raeford Razor on Aug 26, 2020.  Hx of a1c 6.6 over past year. Will get cmp, lipid and a1c today. Eat low sugar diet and and update you on labs when in.  Follow up  3 months or as needed

## 2020-08-21 NOTE — Progress Notes (Signed)
   Subjective:    Patient ID: Lori Gillespie, female    DOB: 1991/01/26, 30 y.o.   MRN: [id number]  HPI  Pt has some severe CTS type symptoms on rt wrist/hand. Sports med A/P Carpal tunnel syndrome of right wrist Has findings on the right side suggestive of more severe carpal tunnel -Counseled on home exercise therapy and supportive care. -Wrist brace. -Prednisone. -Could consider injection or physical therapy.   Pt states she has been on light duty of no lifting excess of 5 lbs. And also continue leave past week per Sports med.  Pt needs fmla forms filled out so can miss occasional days if needed even under light duty guidlines   Review of Systems  Constitutional: Negative for chills, fatigue and fever.  Respiratory: Negative for chest tightness, shortness of breath and wheezing.   Cardiovascular: Negative for chest pain and palpitations.  Musculoskeletal:       See hpi.  Hematological: Negative for adenopathy. Does not bruise/bleed easily.       Objective:   Physical Exam  General- No acute distress. Pleasant patient. Neck- Full range of motion, no jvd Lungs- Clear, even and unlabored. Heart- regular rate and rhythm. Neurologic- CNII- XII grossly intact.      Assessment & Plan:  Persisting CTS symptoms. Follow sports med guidelines/treatment. Wrist cock up splint and prednisone.  Filled out fmla form today. Staff will fax.  See Dr. Raeford Razor on Aug 26, 2020.  Hx of a1c 6.6 over past year. Will get cmp, lipid and a1c today. Eat low sugar diet and and update you on labs when in.  Follow up  3 months or as needed

## 2020-08-22 ENCOUNTER — Telehealth: Payer: Self-pay | Admitting: Medical

## 2020-08-22 ENCOUNTER — Other Ambulatory Visit (HOSPITAL_COMMUNITY): Payer: Self-pay

## 2020-08-22 ENCOUNTER — Ambulatory Visit (INDEPENDENT_AMBULATORY_CARE_PROVIDER_SITE_OTHER): Payer: No Typology Code available for payment source | Admitting: Family Medicine

## 2020-08-22 ENCOUNTER — Ambulatory Visit: Payer: No Typology Code available for payment source | Admitting: Family Medicine

## 2020-08-22 ENCOUNTER — Encounter: Payer: Self-pay | Admitting: Family Medicine

## 2020-08-22 DIAGNOSIS — G5601 Carpal tunnel syndrome, right upper limb: Secondary | ICD-10-CM | POA: Diagnosis not present

## 2020-08-22 LAB — COMPREHENSIVE METABOLIC PANEL
ALT: 39 U/L — ABNORMAL HIGH (ref 0–35)
AST: 19 U/L (ref 0–37)
Albumin: 4.3 g/dL (ref 3.5–5.2)
Alkaline Phosphatase: 93 U/L (ref 39–117)
BUN: 9 mg/dL (ref 6–23)
CO2: 29 mEq/L (ref 19–32)
Calcium: 9.5 mg/dL (ref 8.4–10.5)
Chloride: 101 mEq/L (ref 96–112)
Creatinine, Ser: 0.54 mg/dL (ref 0.40–1.20)
GFR: 123.5 mL/min (ref >60.00)
Glucose, Bld: 99 mg/dL (ref 70–99)
Potassium: 4.6 mEq/L (ref 3.5–5.1)
Sodium: 138 mEq/L (ref 135–145)
Total Bilirubin: 0.6 mg/dL (ref 0.2–1.2)
Total Protein: 7 g/dL (ref 6.0–8.3)

## 2020-08-22 LAB — LDL CHOLESTEROL, DIRECT: Direct LDL: 158 mg/dL

## 2020-08-22 LAB — HEMOGLOBIN A1C: Hgb A1c MFr Bld: 6.8 % — ABNORMAL HIGH (ref 4.6–6.5)

## 2020-08-22 LAB — LIPID PANEL
Cholesterol: 216 mg/dL — ABNORMAL HIGH (ref 0–200)
HDL: 43.9 mg/dL (ref >39.00)
NonHDL: 172.51
Total CHOL/HDL Ratio: 5
Triglycerides: 226 mg/dL — ABNORMAL HIGH (ref 0.0–149.0)
VLDL: 45.2 mg/dL — ABNORMAL HIGH (ref 0.0–40.0)

## 2020-08-22 MED ORDER — METFORMIN HCL 500 MG PO TABS
1000.0000 mg | ORAL_TABLET | Freq: Two times a day (BID) | ORAL | 3 refills | Status: DC
  Filled 2020-08-22 – 2020-11-02 (×2): qty 120, 30d supply, fill #0

## 2020-08-22 NOTE — Patient Instructions (Signed)
Good to see you Please continue the brace  Please send me a message in MyChart with any questions or updates.  Please see me back in 4 weeks or as needed if better.   --Dr. Raeford Razor

## 2020-08-22 NOTE — Telephone Encounter (Signed)
Rx metformin sent to pt pharmacy. 

## 2020-08-22 NOTE — Telephone Encounter (Signed)
FMLA paperwork received from [employer] (total of 6 pages) Forms place in doc bin

## 2020-08-22 NOTE — Progress Notes (Signed)
  Lori Gillespie - 30 y.o. female MRN [id number]  Date of birth: 05-19-1990  SUBJECTIVE:  Including CC & ROS.  No chief complaint on file.   Lori Gillespie is a 30 y.o. female that is following up for her carpal tunnel syndrome.  She has got improvement with the brace.  She has been out of work for the past week.  She does feel like she can go back to work..   Review of Systems See HPI   HISTORY: Past Medical, Surgical, Social, and Family History Reviewed & Updated per EMR.   Pertinent Historical Findings include:  Past Medical History:  Diagnosis Date  . Abnormal uterine bleeding   . Abnormal uterine bleeding   . Anemia   . Fibroids   . PCOS (polycystic ovarian syndrome)     History reviewed. No pertinent surgical history.  History reviewed. No pertinent family history.  Social History   Socioeconomic History  . Marital status: Married    Spouse name: Not on file  . Number of children: Not on file  . Years of education: Not on file  . Highest education level: Not on file  Occupational History  . Not on file  Tobacco Use  . Smoking status: Never Smoker  . Smokeless tobacco: Never Used  Substance and Sexual Activity  . Alcohol use: No  . Drug use: No  . Sexual activity: Not on file  Other Topics Concern  . Not on file  Social History Narrative  . Not on file   Social Determinants of Health   Financial Resource Strain: Not on file  Food Insecurity: Not on file  Transportation Needs: Not on file  Physical Activity: Not on file  Stress: Not on file  Social Connections: Not on file  Intimate Partner Violence: Not on file     PHYSICAL EXAM:  VS: BP 130/90 (BP Location: Left Arm, Patient Position: Sitting, Cuff Size: Large)   Ht 5\' 1"  (1.549 m)   Wt 230 lb (104.3 kg)   BMI 43.46 kg/m  Physical Exam Gen: NAD, alert, cooperative with exam, well-appearing   ASSESSMENT & PLAN:   Carpal tunnel syndrome of right wrist Has had improvement of her  symptoms with conservative measures thus far. -Counseled on home exercise therapy and supportive care. -Provided work note. -Could consider injection physical therapy if needed.

## 2020-08-22 NOTE — Assessment & Plan Note (Signed)
Has had improvement of her symptoms with conservative measures thus far. -Counseled on home exercise therapy and supportive care. -Provided work note. -Could consider injection physical therapy if needed.

## 2020-08-23 ENCOUNTER — Encounter: Payer: Self-pay | Admitting: Family Medicine

## 2020-08-23 ENCOUNTER — Encounter: Payer: Self-pay | Admitting: Medical

## 2020-08-23 ENCOUNTER — Other Ambulatory Visit (HOSPITAL_COMMUNITY): Payer: Self-pay

## 2020-08-23 ENCOUNTER — Ambulatory Visit: Payer: No Typology Code available for payment source | Admitting: Medical

## 2020-08-23 NOTE — Telephone Encounter (Signed)
Forms put in basket

## 2020-08-26 ENCOUNTER — Ambulatory Visit: Payer: No Typology Code available for payment source | Admitting: Family Medicine

## 2020-08-29 ENCOUNTER — Ambulatory Visit (INDEPENDENT_AMBULATORY_CARE_PROVIDER_SITE_OTHER): Payer: No Typology Code available for payment source | Admitting: Family Medicine

## 2020-08-29 ENCOUNTER — Ambulatory Visit: Payer: Self-pay

## 2020-08-29 ENCOUNTER — Other Ambulatory Visit: Payer: Self-pay

## 2020-08-29 ENCOUNTER — Encounter: Payer: Self-pay | Admitting: Family Medicine

## 2020-08-29 VITALS — BP 130/80 | Ht 61.0 in | Wt 230.0 lb

## 2020-08-29 DIAGNOSIS — G5601 Carpal tunnel syndrome, right upper limb: Secondary | ICD-10-CM

## 2020-08-29 MED ORDER — METHYLPREDNISOLONE ACETATE 40 MG/ML IJ SUSP
40.0000 mg | Freq: Once | INTRAMUSCULAR | Status: AC
  Administered 2020-08-29: 40 mg via INTRAMUSCULAR

## 2020-08-29 NOTE — Assessment & Plan Note (Signed)
Acutely worsening of her carpal tunnel syndrome. -Counseled on home exercise therapy and supportive care. -Injection today. -Could consider physical therapy

## 2020-08-29 NOTE — Patient Instructions (Signed)
Good to see you Please continue the brace if it helps  Please send me a message in MyChart with any questions or updates.  Please see me back in 4 weeks.   --Dr. Raeford Razor

## 2020-08-29 NOTE — Progress Notes (Signed)
  Lori Gillespie - 30 y.o. female MRN [id number]  Date of birth: 1990-06-26  SUBJECTIVE:  Including CC & ROS.  No chief complaint on file.   Lori Gillespie is a 30 y.o. female that is presenting with acute exacerbation of her wrist pain.  She did get relief with the brace and the medication but the pain has gotten worse here recently.   Review of Systems See HPI   HISTORY: Past Medical, Surgical, Social, and Family History Reviewed & Updated per EMR.   Pertinent Historical Findings include:  Past Medical History:  Diagnosis Date  . Abnormal uterine bleeding   . Abnormal uterine bleeding   . Anemia   . Fibroids   . PCOS (polycystic ovarian syndrome)     History reviewed. No pertinent surgical history.  History reviewed. No pertinent family history.  Social History   Socioeconomic History  . Marital status: Married    Spouse name: Not on file  . Number of children: Not on file  . Years of education: Not on file  . Highest education level: Not on file  Occupational History  . Not on file  Tobacco Use  . Smoking status: Never Smoker  . Smokeless tobacco: Never Used  Substance and Sexual Activity  . Alcohol use: No  . Drug use: No  . Sexual activity: Not on file  Other Topics Concern  . Not on file  Social History Narrative  . Not on file   Social Determinants of Health   Financial Resource Strain: Not on file  Food Insecurity: Not on file  Transportation Needs: Not on file  Physical Activity: Not on file  Stress: Not on file  Social Connections: Not on file  Intimate Partner Violence: Not on file     PHYSICAL EXAM:  VS: BP 130/80 (BP Location: Left Arm, Patient Position: Sitting, Cuff Size: Large)   Ht 5\' 1"  (1.549 m)   Wt 230 lb (104.3 kg)   BMI 43.46 kg/m  Physical Exam Gen: NAD, alert, cooperative with exam, well-appearing   Limited ultrasound: Right wrist:  Median nerve is flattened and enlarged within the carpal tunnel.  Summary: Carpal  tunnel syndrome  Ultrasound and interpretation by Clearance Coots, MD    Aspiration/Injection Procedure Note Lori Gillespie Nov 23, 1990  Procedure: Injection Indications: Right carpal tunnel syndrome  Procedure Details Consent: Risks of procedure as well as the alternatives and risks of each were explained to the (patient/caregiver).  Consent for procedure obtained. Time Out: Verified patient identification, verified procedure, site/side was marked, verified correct patient position, special equipment/implants available, medications/allergies/relevent history reviewed, required imaging and test results available.  Performed.  The area was cleaned with iodine and alcohol swabs.    The right carpal tunnel syndrome was injected using 1 cc's of 40 mg Depo-Medrol and 1 cc's of 0.25% bupivacaine with a 25 1 1/2" needle.  Ultrasound was used. Images were obtained in long views showing the injection.     A sterile dressing was applied.  Patient did tolerate procedure well.    ASSESSMENT & PLAN:   Carpal tunnel syndrome of right wrist Acutely worsening of her carpal tunnel syndrome. -Counseled on home exercise therapy and supportive care. -Injection today. -Could consider physical therapy

## 2020-08-29 NOTE — Addendum Note (Signed)
Addended by: Cresenciano Lick on: 08/29/2020 03:30 PM   Modules accepted: Orders

## 2020-08-30 ENCOUNTER — Encounter: Payer: Self-pay | Admitting: Family Medicine

## 2020-08-30 ENCOUNTER — Telehealth: Payer: Self-pay | Admitting: Medical

## 2020-08-30 NOTE — Telephone Encounter (Signed)
FMLA forms faxed into front office Placed into provider folder

## 2020-09-02 ENCOUNTER — Ambulatory Visit: Payer: No Typology Code available for payment source | Admitting: Medical

## 2020-09-02 ENCOUNTER — Other Ambulatory Visit (HOSPITAL_COMMUNITY): Payer: Self-pay

## 2020-09-02 ENCOUNTER — Ambulatory Visit: Payer: No Typology Code available for payment source | Admitting: Family Medicine

## 2020-09-05 ENCOUNTER — Telehealth: Payer: Self-pay | Admitting: Medical

## 2020-09-05 NOTE — Telephone Encounter (Signed)
Will you send this note back to me as message. As reminder to refill out fmla form again.   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 I hope you are doing well I just wanted to personally ask you if you could please fill out another FMLA form (to care for myself) and change the frequency because I have used it all up for this month due to carpal tunnel flares and being on medical leave for a week. I was seen by Doctor Ysidro Evert last week and was given a Cortisone injection which caused another flare up but I was told it was normal and one of the symptoms. If you could please fax the new forms to [employer], that'd be really awesome. Can you change the frequency from 2 episodes for 1 month to 3 episodes for 1 month with 3 days per episode. I just needed to cover the days I was out of work to not accrue any points on my attendance. Please do let me know if you need me to come. You can keep everything on the FMLA forms, just the frequency please. Thank you! I have attached a blank form for you.

## 2020-09-09 ENCOUNTER — Telehealth: Payer: Self-pay | Admitting: Medical

## 2020-09-09 ENCOUNTER — Ambulatory Visit: Payer: No Typology Code available for payment source | Admitting: Medical

## 2020-09-09 NOTE — Telephone Encounter (Signed)
   Hello, How are you? It's due on the 24th of this month. I can come in if you need me to. Also, I've missed these days 05/04, 05/05, 05/06, 05/07 & 05/08. Thank you!

## 2020-09-16 ENCOUNTER — Ambulatory Visit: Payer: No Typology Code available for payment source | Admitting: Medical

## 2020-09-18 ENCOUNTER — Ambulatory Visit: Payer: No Typology Code available for payment source | Admitting: Medical

## 2020-09-30 ENCOUNTER — Ambulatory Visit: Payer: No Typology Code available for payment source | Admitting: Family Medicine

## 2020-10-02 ENCOUNTER — Ambulatory Visit (HOSPITAL_BASED_OUTPATIENT_CLINIC_OR_DEPARTMENT_OTHER): Admit: 2020-10-02 | Payer: No Typology Code available for payment source | Admitting: Obstetrics and Gynecology

## 2020-10-02 ENCOUNTER — Encounter (HOSPITAL_BASED_OUTPATIENT_CLINIC_OR_DEPARTMENT_OTHER): Payer: Self-pay

## 2020-10-02 SURGERY — LAPAROSCOPIC GELPORT ASSISTED MYOMECTOMY
Anesthesia: General

## 2020-10-03 ENCOUNTER — Other Ambulatory Visit (HOSPITAL_COMMUNITY): Payer: Self-pay

## 2020-10-03 MED FILL — Letrozole Tab 2.5 MG: ORAL | 30 days supply | Qty: 30 | Fill #0 | Status: AC

## 2020-10-14 ENCOUNTER — Ambulatory Visit (INDEPENDENT_AMBULATORY_CARE_PROVIDER_SITE_OTHER): Payer: No Typology Code available for payment source | Admitting: Medical

## 2020-10-14 ENCOUNTER — Other Ambulatory Visit: Payer: Self-pay

## 2020-10-14 VITALS — BP 121/81 | HR 100 | Temp 98.5°F | Resp 18 | Ht 61.0 in | Wt 233.4 lb

## 2020-10-14 DIAGNOSIS — Z0000 Encounter for general adult medical examination without abnormal findings: Secondary | ICD-10-CM | POA: Diagnosis not present

## 2020-10-14 DIAGNOSIS — E118 Type 2 diabetes mellitus with unspecified complications: Secondary | ICD-10-CM

## 2020-10-14 DIAGNOSIS — Z23 Encounter for immunization: Secondary | ICD-10-CM | POA: Diagnosis not present

## 2020-10-14 NOTE — Addendum Note (Signed)
Addended by: Jeronimo Greaves on: 10/14/2020 02:07 PM   Modules accepted: Orders

## 2020-10-14 NOTE — Patient Instructions (Addendum)
For you wellness exam today I have ordered cbc, cmp and lipid panel(future labs placed).  Urine microalbumin added since pt is diabetic.  Vaccine given today. Tdap.  Recommend exercise and healthy diet.  We will let you know lab results as they come in.  Follow up date appointment will be determined after lab review.    Preventive Care 70-31 Years Old, Female Preventive care refers to lifestyle choices and visits with your health care provider that can promote health and wellness. This includes: A yearly physical exam. This is also called an annual wellness visit. Regular dental and eye exams. Immunizations. Screening for certain conditions. Healthy lifestyle choices, such as: Eating a healthy diet. Getting regular exercise. Not using drugs or products that contain nicotine and tobacco. Limiting alcohol use. What can I expect for my preventive care visit? Physical exam Your health care provider may check your: Height and weight. These may be used to calculate your BMI (body mass index). BMI is a measurement that tells if you are at a healthy weight. Heart rate and blood pressure. Body temperature. Skin for abnormal spots. Counseling Your health care provider may ask you questions about your: Past medical problems. Family's medical history. Alcohol, tobacco, and drug use. Emotional well-being. Home life and relationship well-being. Sexual activity. Diet, exercise, and sleep habits. Work and work Statistician. Access to firearms. Method of birth control. Menstrual cycle. Pregnancy history. What immunizations do I need?  Vaccines are usually given at various ages, according to a schedule. Your health care provider will recommend vaccines for you based on your age, medicalhistory, and lifestyle or other factors, such as travel or where you work. What tests do I need?  Blood tests Lipid and cholesterol levels. These may be checked every 5 years starting at age  75. Hepatitis C test. Hepatitis B test. Screening Diabetes screening. This is done by checking your blood sugar (glucose) after you have not eaten for a while (fasting). STD (sexually transmitted disease) testing, if you are at risk. BRCA-related cancer screening. This may be done if you have a family history of breast, ovarian, tubal, or peritoneal cancers. Pelvic exam and Pap test. This may be done every 3 years starting at age 44. Starting at age 64, this may be done every 5 years if you have a Pap test in combination with an HPV test. Talk with your health care provider about your test results, treatment options,and if necessary, the need for more tests. Follow these instructions at home: Eating and drinking  Eat a healthy diet that includes fresh fruits and vegetables, whole grains, lean protein, and low-fat dairy products. Take vitamin and mineral supplements as recommended by your health care provider. Do not drink alcohol if: Your health care provider tells you not to drink. You are pregnant, may be pregnant, or are planning to become pregnant. If you drink alcohol: Limit how much you have to 0-1 drink a day. Be aware of how much alcohol is in your drink. In the [place] one drink equals one 12 oz bottle of beer (355 mL), one 5 oz glass of wine (148 mL), or one 1 oz glass of hard liquor (44 mL).  Lifestyle Take daily care of your teeth and gums. Brush your teeth every morning and night with fluoride toothpaste. Floss one time each day. Stay active. Exercise for at least 30 minutes 5 or more days each week. Do not use any products that contain nicotine or tobacco, such as cigarettes, e-cigarettes, and chewing tobacco.  If you need help quitting, ask your health care provider. Do not use drugs. If you are sexually active, practice safe sex. Use a condom or other form of protection to prevent STIs (sexually transmitted infections). If you do not wish to become pregnant, use a form of  birth control. If you plan to become pregnant, see your health care provider for a prepregnancy visit. Find healthy ways to cope with stress, such as: Meditation, yoga, or listening to music. Journaling. Talking to a trusted person. Spending time with friends and family. Safety Always wear your seat belt while driving or riding in a vehicle. Do not drive: If you have been drinking alcohol. Do not ride with someone who has been drinking. When you are tired or distracted. While texting. Wear a helmet and other protective equipment during sports activities. If you have firearms in your house, make sure you follow all gun safety procedures. Seek help if you have been physically or sexually abused. What's next? Go to your health care provider once a year for an annual wellness visit. Ask your health care provider how often you should have your eyes and teeth checked. Stay up to date on all vaccines. This information is not intended to replace advice given to you by your health care provider. Make sure you discuss any questions you have with your healthcare provider. Document Revised: 12/10/2019 Document Reviewed: 12/23/2017 Elsevier Patient Education  2022 [employer].

## 2020-10-14 NOTE — Progress Notes (Signed)
Subjective:    Patient ID: Lori Gillespie, female    DOB: [birth year], 30 y.o.   MRN: [id number]  HPI  Pt in for wellness exam.   Pt is not fasting. Pt has works for [employer]. She exercises 3 times a week.   She is due for tetanus vaccine.    Review of Systems  Constitutional:  Negative for chills, fatigue and fever.  HENT:  Negative for congestion.   Respiratory:  Negative for cough, chest tightness, shortness of breath, wheezing and stridor.   Cardiovascular:  Negative for chest pain and palpitations.  Gastrointestinal:  Negative for abdominal pain.  Genitourinary:  Negative for dysuria.  Musculoskeletal:  Negative for back pain.  Skin:  Negative for rash.  Neurological:  Negative for dizziness, light-headedness, numbness and headaches.  Hematological:  Negative for adenopathy. Does not bruise/bleed easily.  Psychiatric/Behavioral:  Negative for behavioral problems, confusion, decreased concentration, dysphoric mood and hallucinations.      Past Medical History:  Diagnosis Date   Abnormal uterine bleeding    Abnormal uterine bleeding    Anemia    Fibroids    PCOS (polycystic ovarian syndrome)      Social History   Socioeconomic History   Marital status: Married    Spouse name: Not on file   Number of children: Not on file   Years of education: Not on file   Highest education level: Not on file  Occupational History   Not on file  Tobacco Use   Smoking status: Never   Smokeless tobacco: Never  Substance and Sexual Activity   Alcohol use: No   Drug use: No   Sexual activity: Not on file  Other Topics Concern   Not on file  Social History Narrative   Not on file   Social Determinants of Health   Financial Resource Strain: Not on file  Food Insecurity: Not on file  Transportation Needs: Not on file  Physical Activity: Not on file  Stress: Not on file  Social Connections: Not on file  Intimate Partner Violence: Not on file    No past surgical  history on file.  No family history on file.  No Known Allergies  Current Outpatient Medications on File Prior to Visit  Medication Sig Dispense Refill   albuterol (VENTOLIN HFA) 108 (90 Base) MCG/ACT inhaler INHALE 1-2 PUFFS INTO THE LUNGS EVERY 6 HOURS AS NEEDED FOR WHEEZING OR SHORTNESS OF BREATH. 18 g 0   cetirizine (ZYRTEC) 10 MG tablet TAKE 1 TABLET BY MOUTH ONCE A DAY 90 tablet 0   Cyanocobalamin (VITAMIN B 12 PO) Take 1 tablet daily by mouth.     gabapentin (NEURONTIN) 100 MG capsule Take 1 capsule (100 mg total) by mouth 3 (three) times daily. 90 capsule 0   metFORMIN (GLUCOPHAGE) 500 MG tablet Take 2 tablets (1,000 mg total) by mouth 2 (two) times daily. 120 tablet 3   naproxen sodium (ALEVE) 220 MG tablet Take 440 mg 2 (two) times daily as needed by mouth.     danazol (DANOCRINE) 200 MG capsule 200 mg.     danazol (DANOCRINE) 200 MG capsule TAKE 1 CAPSULE BY MOUTH TWICE DAILY AS NEEDED 60 capsule 11   letrozole (FEMARA) 2.5 MG tablet 2.5 mg.     letrozole (FEMARA) 2.5 MG tablet TAKE 1 TABLET BY MOUTH ONCE DAILY 30 tablet 11   No current facility-administered medications on file prior to visit.    BP 121/81   Pulse 100   Temp 98.5  F (36.9 C)   Resp 18   Ht 5\' 1"  (1.549 m)   Wt 233 lb 6.4 oz (105.9 kg)   SpO2 97%   BMI 44.10 kg/m       Objective:   Physical Exam   General Mental Status- Alert. General Appearance- Not in acute distress.   Skin General: Color- Normal Color. Moisture- Normal Moisture.  Neck Carotid Arteries- Normal color. Moisture- Normal Moisture. No carotid bruits. No JVD.  Chest and Lung Exam Auscultation: Breath Sounds:-Normal.  Cardiovascular Auscultation:Rythm- Regular. Murmurs & Other Heart Sounds:Auscultation of the heart reveals- No Murmurs.  Abdomen Inspection:-Inspeection Normal. Palpation/Percussion:Note:No mass. Palpation and Percussion of the abdomen reveal- Non Tender, Non Distended + BS, no rebound or  guarding.   Neurologic Cranial Nerve exam:- CN III-XII intact(No nystagmus), symmetric smile. Strength:- 5/5 equal and symmetric strength both upper and lower extremities.      Assessment & Plan:   For you wellness exam today I have ordered cbc, cmp and lipid panel(future labs placed).  Urine microalbumin added since pt is diabetic.  Vaccine given today. Tdap.  Recommend exercise and healthy diet.  We will let you know lab results as they come in.  Follow up date appointment will be determined after lab review.   Mackie Pai, PA-C

## 2020-10-15 ENCOUNTER — Telehealth: Payer: Self-pay | Admitting: Medical

## 2020-10-15 NOTE — Telephone Encounter (Signed)
FMLA form faxed into front office Placed into [place] bin up front

## 2020-10-16 NOTE — Telephone Encounter (Signed)
Document form faxed in office again to be filled out ([employer]- 7 pages) Document put at front office tray under providers name.

## 2020-10-17 ENCOUNTER — Encounter: Payer: Self-pay | Admitting: Medical

## 2020-10-17 ENCOUNTER — Telehealth: Payer: Self-pay | Admitting: Medical

## 2020-10-17 NOTE — Telephone Encounter (Signed)
Will you pull old forms and compare to prior forms. If you can fill out as much as posibble extremeley helpful. She has appoitnment tomorrow.   Thanks for filling out other form for me today.

## 2020-10-18 ENCOUNTER — Other Ambulatory Visit: Payer: Self-pay

## 2020-10-18 ENCOUNTER — Other Ambulatory Visit: Payer: No Typology Code available for payment source

## 2020-10-18 ENCOUNTER — Telehealth (INDEPENDENT_AMBULATORY_CARE_PROVIDER_SITE_OTHER): Payer: No Typology Code available for payment source | Admitting: Medical

## 2020-10-18 DIAGNOSIS — G5601 Carpal tunnel syndrome, right upper limb: Secondary | ICD-10-CM

## 2020-10-18 NOTE — Progress Notes (Signed)
   Subjective:    Patient ID: Lori Gillespie, female    DOB: 11-13-1990, 30 y.o.   MRN: [id number]  HPI    Review of Systems     Objective:   Physical Exam        Assessment & Plan:

## 2020-10-18 NOTE — Progress Notes (Signed)
   Subjective:    Patient ID: Lori Gillespie, female    DOB: 1990/10/21, 30 y.o.   MRN: [id number]  HPI  Virtual Visit via Telephone Note  I connected with Lori Gillespie on 10/18/20 at  1:20 PM EDT by telephone and verified that I am speaking with the correct person using two identifiers.  Location: Patient: home Provider: office   I discussed the limitations, risks, security and privacy concerns of performing an evaluation and management service by telephone and the availability of in person appointments. I also discussed with the patient that there may be a patient responsible charge related to this service. The patient expressed understanding and agreed to proceed.   History of Present Illness: Hx of cts. Symptoms still flaring and having to miss work. Pt is seeing sports medicine. Getting injections and per pt she might need to get surgery if injections fail.   Observations/Objective: General- no acute distress, pleasant, alert and normal speech.   Assessment and Plan: Filled out fmla form. Continue to see sport medicine.  Time spent filling out form with pt today and documenting 43 minutes.  Mackie Pai, PA-C   Follow Up Instructions:    I discussed the assessment and treatment plan with the patient. The patient was provided an opportunity to ask questions and all were answered. The patient agreed with the plan and demonstrated an understanding of the instructions.   The patient was advised to call back or seek an in-person evaluation if the symptoms worsen or if the condition fails to improve as anticipated.  Time spent with patient today was  43 minutes which consisted of chart review, discussing diagnosis, work up, treatment, filling out forms and documentation.    Mackie Pai, PA-C    Review of Systems     Objective:   Physical Exam        Assessment & Plan:

## 2020-10-18 NOTE — Patient Instructions (Addendum)
Filled out fmla form. Continue to see sport medicine.  Time spent filling out form with pt today and documenting 43 minutes.

## 2020-10-21 ENCOUNTER — Ambulatory Visit: Payer: No Typology Code available for payment source | Admitting: Family Medicine

## 2020-10-21 ENCOUNTER — Encounter: Payer: Self-pay | Admitting: Family Medicine

## 2020-10-21 NOTE — Progress Notes (Deleted)
  Lori Gillespie - 30 y.o. female MRN [id number]  Date of birth: [birth year]  SUBJECTIVE:  Including CC & ROS.  No chief complaint on file.   Lori Gillespie is a 30 y.o. female that is  ***.  ***   Review of Systems See HPI   HISTORY: Past Medical, Surgical, Social, and Family History Reviewed & Updated per EMR.   Pertinent Historical Findings include:  Past Medical History:  Diagnosis Date   Abnormal uterine bleeding    Abnormal uterine bleeding    Anemia    Fibroids    PCOS (polycystic ovarian syndrome)     No past surgical history on file.  No family history on file.  Social History   Socioeconomic History   Marital status: Married    Spouse name: Not on file   Number of children: Not on file   Years of education: Not on file   Highest education level: Not on file  Occupational History   Not on file  Tobacco Use   Smoking status: Never   Smokeless tobacco: Never  Substance and Sexual Activity   Alcohol use: No   Drug use: No   Sexual activity: Not on file  Other Topics Concern   Not on file  Social History Narrative   Not on file   Social Determinants of Health   Financial Resource Strain: Not on file  Food Insecurity: Not on file  Transportation Needs: Not on file  Physical Activity: Not on file  Stress: Not on file  Social Connections: Not on file  Intimate Partner Violence: Not on file     PHYSICAL EXAM:  VS: There were no vitals taken for this visit. Physical Exam Gen: NAD, alert, cooperative with exam, well-appearing MSK:  ***      ASSESSMENT & PLAN:   No problem-specific Assessment & Plan notes found for this encounter.

## 2020-10-23 ENCOUNTER — Encounter: Payer: Self-pay | Admitting: Medical

## 2020-10-29 ENCOUNTER — Telehealth: Payer: Self-pay | Admitting: Medical

## 2020-10-29 NOTE — Telephone Encounter (Signed)
Pt form is on your desk. Her husband name was on the form she provided. I filled out form or her conditions. I marked thru his name today. What else does she want me to address.  Talk with her/clarify.

## 2020-10-29 NOTE — Telephone Encounter (Signed)
Communicated with patient via my chart

## 2020-10-29 NOTE — Telephone Encounter (Signed)
Spoke with patient , forms need to be updated , one of the pages stated 2021 instead of 2022 , I copied previous forms from 10/18/2020 and made date corrections , please review and sign . FMLA forms placed on desk.

## 2020-10-30 NOTE — Telephone Encounter (Signed)
Updated forms faxed

## 2020-11-02 ENCOUNTER — Other Ambulatory Visit: Payer: Self-pay | Admitting: Urgent Care

## 2020-11-02 ENCOUNTER — Other Ambulatory Visit (HOSPITAL_COMMUNITY): Payer: Self-pay

## 2020-11-02 MED FILL — Cetirizine HCl Tab 10 MG: ORAL | 90 days supply | Qty: 90 | Fill #0 | Status: CN

## 2020-11-04 ENCOUNTER — Other Ambulatory Visit (HOSPITAL_COMMUNITY): Payer: Self-pay

## 2020-11-04 ENCOUNTER — Ambulatory Visit: Payer: No Typology Code available for payment source | Admitting: Medical

## 2020-11-11 ENCOUNTER — Other Ambulatory Visit (HOSPITAL_COMMUNITY): Payer: Self-pay

## 2020-11-26 ENCOUNTER — Other Ambulatory Visit (HOSPITAL_COMMUNITY): Payer: Self-pay

## 2020-11-26 MED FILL — Danazol Cap 200 MG: ORAL | 30 days supply | Qty: 60 | Fill #0 | Status: CN

## 2020-11-26 MED FILL — Letrozole Tab 2.5 MG: ORAL | 30 days supply | Qty: 30 | Fill #1 | Status: CN

## 2020-11-29 ENCOUNTER — Ambulatory Visit: Payer: No Typology Code available for payment source | Admitting: Family Medicine

## 2020-12-04 ENCOUNTER — Other Ambulatory Visit (HOSPITAL_COMMUNITY): Payer: Self-pay

## 2020-12-04 MED FILL — Letrozole Tab 2.5 MG: ORAL | 30 days supply | Qty: 30 | Fill #1 | Status: AC

## 2020-12-11 ENCOUNTER — Other Ambulatory Visit (HOSPITAL_COMMUNITY): Payer: Self-pay

## 2021-01-09 ENCOUNTER — Encounter: Payer: Self-pay | Admitting: Family Medicine

## 2021-01-11 ENCOUNTER — Other Ambulatory Visit: Payer: Self-pay

## 2021-01-11 ENCOUNTER — Emergency Department (INDEPENDENT_AMBULATORY_CARE_PROVIDER_SITE_OTHER)
Admission: EM | Admit: 2021-01-11 | Discharge: 2021-01-11 | Disposition: A | Discharge status: Home / Self Care | Payer: No Typology Code available for payment source | Source: Home / Self Care | Attending: Family Medicine | Admitting: Family Medicine

## 2021-01-11 DIAGNOSIS — S0502XA Injury of conjunctiva and corneal abrasion without foreign body, left eye, initial encounter: Secondary | ICD-10-CM | POA: Diagnosis not present

## 2021-01-11 MED ORDER — ERYTHROMYCIN 5 MG/GM OP OINT: TOPICAL_OINTMENT | Freq: Once | OPHTHALMIC | Status: AC

## 2021-01-11 NOTE — Discharge Instructions (Addendum)
Use eye ointment 3-4 times a day for 5 to 7 days until symptoms resolve  If you do not see definite improvement by Tuesday you need to see an eye specialist.  If you call we can assist you, or you can call your primary care doctor

## 2021-01-11 NOTE — ED Triage Notes (Signed)
Pt presents to Urgent Care with c/o R eye irritation and redness. Reports feeling a hair in her eye yesterday and states redness has worsened today. Denies visual disturbance.

## 2021-01-11 NOTE — ED Provider Notes (Signed)
[facility]    CSN: [id number] Arrival date & time: 01/11/21  1521      History   Chief Complaint Chief Complaint  Patient presents with   Eye Problem    HPI Lori Gillespie is a 30 y.o. female.   HPI  Patient felt something fly into her eye yesterday at work.  She states she immediately rubbed her eye.  Then she went the bathroom and rinsed it.  Today she woke up with a red eye that is irritated and tearing.  No photophobia or visual defect  Past Medical History:  Diagnosis Date   Abnormal uterine bleeding    Abnormal uterine bleeding    Anemia    Fibroids    PCOS (polycystic ovarian syndrome)     Patient Active Problem List   Diagnosis Date Noted   Carpal tunnel syndrome of right wrist 07/30/2020   Symptomatic anemia    Abnormal uterine bleeding (AUB) 03/05/2017    History reviewed. No pertinent surgical history.  OB History   No obstetric history on file.      Home Medications    Prior to Admission medications   Medication Sig Start Date End Date Taking? Authorizing Provider  albuterol (VENTOLIN HFA) 108 (90 Base) MCG/ACT inhaler INHALE 1-2 PUFFS INTO THE LUNGS EVERY 6 HOURS AS NEEDED FOR WHEEZING OR SHORTNESS OF BREATH. 06/19/20 06/19/21  Jaynee Eagles, PA-C  cetirizine (ZYRTEC) 10 MG tablet TAKE 1 TABLET BY MOUTH ONCE A DAY 06/19/20 06/19/21  Jaynee Eagles, PA-C  danazol (DANOCRINE) 200 MG capsule 200 mg. 10/20/18   [provider]  danazol (DANOCRINE) 200 MG capsule TAKE 1 CAPSULE BY MOUTH TWICE DAILY AS NEEDED 01/29/20 01/28/21  Governor Specking, MD  letrozole [facility]) 2.5 MG tablet 2.5 mg. 10/20/18   [provider]  letrozole ([place]) 2.5 MG tablet TAKE 1 TABLET BY MOUTH ONCE DAILY 01/29/20 01/28/21  Governor Specking, MD  naproxen sodium (ALEVE) 220 MG tablet Take 440 mg 2 (two) times daily as needed by mouth.    [provider]    Family History Family History  Problem Relation Age of Onset   Hypertension Mother     Diabetes Mother    Hypertension Father    Diabetes Father     Social History Social History   Tobacco Use   Smoking status: Never   Smokeless tobacco: Never  Vaping Use   Vaping Use: Never used  Substance Use Topics   Alcohol use: No   Drug use: No     Allergies   Patient has no known allergies.   Review of Systems Review of Systems See HPI  Physical Exam Triage Vital Signs ED Triage Vitals  Enc Vitals Group     BP 01/11/21 1543 (!) 139/94     Pulse Rate 01/11/21 1543 94     Resp 01/11/21 1543 18     Temp 01/11/21 1543 98.3 F (36.8 C)     Temp Source 01/11/21 1543 Oral     SpO2 01/11/21 1543 97 %     Weight 01/11/21 1539 230 lb (104.3 kg)     Height 01/11/21 1539 5' (1.524 m)     Head Circumference --      Peak Flow --      Pain Score 01/11/21 1539 0     Pain Loc --      Pain Edu? --      Excl. in [place]? --    No data found.  Updated Vital Signs  BP 115/79 (BP Location: Right Arm)   Pulse 94   Temp 98.3 F (36.8 C) (Oral)   Resp 18   Ht 5' (1.524 m)   Wt 104.3 kg   LMP  (LMP Unknown) Comment: Has not had period in several years d/t medications for fibroids  SpO2 97%   BMI 44.92 kg/m   Visual Acuity Right Eye Distance: 20/20 Left Eye Distance: 20/50 Bilateral Distance: 20/20   Physical Exam Constitutional:      General: She is not in acute distress.    Appearance: Normal appearance. She is well-developed.  HENT:     Head: Normocephalic and atraumatic.  Eyes:     General: Lids are normal. Vision grossly intact. Gaze aligned appropriately.        Right eye: Foreign body, discharge and hordeolum present.     Conjunctiva/sclera:     Right eye: Right conjunctiva is injected.     Pupils: Pupils are equal, round, and reactive to light.      Comments: Mask is in place Right eye has conjunctival injection  Cardiovascular:     Rate and Rhythm: Normal rate.  Pulmonary:     Effort: Pulmonary effort is normal. No respiratory distress.  Abdominal:      General: There is no distension.     Palpations: Abdomen is soft.  Musculoskeletal:        General: Normal range of motion.     Cervical back: Normal range of motion.  Skin:    General: Skin is warm and dry.  Neurological:     Mental Status: She is alert.     UC Treatments / Results  Labs (all labs ordered are listed, but only abnormal results are displayed) Labs Reviewed - No data to display  EKG   Radiology No results found.  Procedures Procedures (including critical care time)  Medications Ordered in UC Medications  erythromycin ophthalmic ointment (has no administration in time range)    Initial Impression / Assessment and Plan / UC Course  I have reviewed the triage vital signs and the nursing notes.  Pertinent labs & imaging results that were available during my care of the patient were reviewed by me and considered in my medical decision making (see chart for details).     Discussed corneal abrasion.  Healing.  Reasons for return Final Clinical Impressions(s) / UC Diagnoses   Final diagnoses:  Corneal abrasion, left, initial encounter     Discharge Instructions      Use eye ointment 3-4 times a day for 5 to 7 days until symptoms resolve  If you do not see definite improvement by Tuesday you need to see an eye specialist.  If you call we can assist you, or you can call your primary care doctor   ED Prescriptions   None    PDMP not reviewed this encounter.   Raylene Everts, MD 01/11/21 [phone]

## 2021-01-22 ENCOUNTER — Ambulatory Visit: Payer: No Typology Code available for payment source | Admitting: Family Medicine

## 2021-01-30 ENCOUNTER — Ambulatory Visit: Payer: Self-pay

## 2021-01-30 ENCOUNTER — Ambulatory Visit (INDEPENDENT_AMBULATORY_CARE_PROVIDER_SITE_OTHER): Payer: No Typology Code available for payment source | Admitting: Family Medicine

## 2021-01-30 ENCOUNTER — Encounter: Payer: Self-pay | Admitting: Family Medicine

## 2021-01-30 VITALS — Ht 60.0 in | Wt 230.0 lb

## 2021-01-30 DIAGNOSIS — G5601 Carpal tunnel syndrome, right upper limb: Secondary | ICD-10-CM

## 2021-01-30 MED ORDER — METHYLPREDNISOLONE ACETATE 40 MG/ML IJ SUSP
40.0000 mg | Freq: Once | INTRAMUSCULAR | Status: AC
Start: 2021-01-30 — End: 2021-01-30
  Administered 2021-01-30: 40 mg

## 2021-01-30 NOTE — Patient Instructions (Signed)
Good to see you Please try aquaphor on your hands    Please send me a message in MyChart with any questions or updates.  Please see me back as needed.   --Dr. Raeford Razor

## 2021-01-30 NOTE — Assessment & Plan Note (Signed)
Acute on chronic in nature.  Does have enlargement and flattening of the nerve. -Counseled on home exercise therapy and supportive care. -Injection today. -Could consider sending for nerve study.

## 2021-01-30 NOTE — Progress Notes (Signed)
Lori Gillespie - 30 y.o. female MRN [id number]  Date of birth: 01-Apr-1991  SUBJECTIVE:  Including CC & ROS.  No chief complaint on file.   Lori Gillespie is a 30 y.o. female that is presenting with acute on chronic right carpal tunnel syndrome.  Exacerbated with activities at work.    Review of Systems See HPI   HISTORY: Past Medical, Surgical, Social, and Family History Reviewed & Updated per EMR.   Pertinent Historical Findings include:  Past Medical History:  Diagnosis Date   Abnormal uterine bleeding    Abnormal uterine bleeding    Anemia    Fibroids    PCOS (polycystic ovarian syndrome)     History reviewed. No pertinent surgical history.  Family History  Problem Relation Age of Onset   Hypertension Mother    Diabetes Mother    Hypertension Father    Diabetes Father     Social History   Socioeconomic History   Marital status: Married    Spouse name: Not on file   Number of children: Not on file   Years of education: Not on file   Highest education level: Not on file  Occupational History   Not on file  Tobacco Use   Smoking status: Never   Smokeless tobacco: Never  Vaping Use   Vaping Use: Never used  Substance and Sexual Activity   Alcohol use: No   Drug use: No   Sexual activity: Not on file  Other Topics Concern   Not on file  Social History Narrative   Not on file   Social Determinants of Health   Financial Resource Strain: Not on file  Food Insecurity: Not on file  Transportation Needs: Not on file  Physical Activity: Not on file  Stress: Not on file  Social Connections: Not on file  Intimate Partner Violence: Not on file     PHYSICAL EXAM:  VS: Ht 5' (1.524 m)   Wt 230 lb (104.3 kg)   LMP  (LMP Unknown) Comment: Has not had period in several years d/t medications for fibroids  BMI 44.92 kg/m  Physical Exam Gen: NAD, alert, cooperative with exam, well-appearing   Limited ultrasound: Right hand:  There is circumference  of the median nerve in the carpal tunnel is larger than normal and is flattened with hypoechoic changes in the nerve.  Summary: Carpal tunnel syndrome.  Ultrasound and interpretation by Clearance Coots, MD   Aspiration/Injection Procedure Note Lori Gillespie 13-Dec-1990  Procedure: Injection Indications: Right carpal tunnel syndrome  Procedure Details Consent: Risks of procedure as well as the alternatives and risks of each were explained to the (patient/caregiver).  Consent for procedure obtained. Time Out: Verified patient identification, verified procedure, site/side was marked, verified correct patient position, special equipment/implants available, medications/allergies/relevent history reviewed, required imaging and test results available.  Performed.  The area was cleaned with iodine and alcohol swabs.    The right carpal tunnel was injected using 1 cc's of 40 mg Depo-Medrol and 4 cc's of 0.25% bupivacaine with a 25 1 1/2" needle.  Ultrasound was used. Images were obtained in long views showing the injection.     A sterile dressing was applied.  Patient did tolerate procedure well.     ASSESSMENT & PLAN:   Carpal tunnel syndrome of right wrist Acute on chronic in nature.  Does have enlargement and flattening of the nerve. -Counseled on home exercise therapy and supportive care. -Injection today. -Could consider sending for nerve study.

## 2021-02-07 ENCOUNTER — Other Ambulatory Visit: Payer: Self-pay

## 2021-02-07 ENCOUNTER — Other Ambulatory Visit (HOSPITAL_COMMUNITY): Payer: Self-pay

## 2021-02-07 MED ORDER — LETROZOLE 2.5 MG PO TABS
2.5000 mg | ORAL_TABLET | Freq: Every day | ORAL | 1 refills | Status: DC
  Filled 2021-02-07: qty 30, 30d supply, fill #0
  Filled 2021-04-28: qty 30, 30d supply, fill #1

## 2021-02-10 ENCOUNTER — Other Ambulatory Visit (HOSPITAL_COMMUNITY): Payer: Self-pay

## 2021-02-21 ENCOUNTER — Other Ambulatory Visit (HOSPITAL_COMMUNITY): Payer: Self-pay

## 2021-02-21 ENCOUNTER — Ambulatory Visit
Admission: EM | Admit: 2021-02-21 | Discharge: 2021-02-21 | Disposition: A | Discharge status: Home / Self Care | Payer: No Typology Code available for payment source | Attending: Emergency Medicine | Admitting: Emergency Medicine

## 2021-02-21 ENCOUNTER — Other Ambulatory Visit: Payer: Self-pay

## 2021-02-21 DIAGNOSIS — L209 Atopic dermatitis, unspecified: Secondary | ICD-10-CM

## 2021-02-21 MED ORDER — TRIAMCINOLONE ACETONIDE 0.1 % EX CREA
1.0000 "application " | TOPICAL_CREAM | Freq: Two times a day (BID) | CUTANEOUS | 0 refills | Status: DC
  Filled 2021-02-21: qty 80, 40d supply, fill #0

## 2021-02-21 NOTE — ED Provider Notes (Signed)
[facility]    CSN: [id number] Arrival date & time: 02/21/21  1446      History   Chief Complaint Chief Complaint  Patient presents with   Rash    HPI Lori Gillespie is a 30 y.o. female.   Patient reports a recent onset of rash on the backs of her hands.  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  Patient states she is noticed that recently they changed the handwashing detergent and feels this is about when she began to have the dry itchy rash.  Patient states she is been scratching a lot, has attempted to use Aquaphor ointment with some relief of the rash on the palms of her hands however the backs of her hands have become worse, are now inflamed with papular lesions and even itchier than before.  Patient denies upper respiratory findings such as shortness of breath, cough, sneezing, runny nose, itchy eyes, unusual skin manifestations in any other parts of her body.  The history is provided by the patient.   Past Medical History:  Diagnosis Date   Abnormal uterine bleeding    Abnormal uterine bleeding    Anemia    Fibroids    PCOS (polycystic ovarian syndrome)     Patient Active Problem List   Diagnosis Date Noted   Carpal tunnel syndrome of right wrist 07/30/2020   Symptomatic anemia    Abnormal uterine bleeding (AUB) 03/05/2017    History reviewed. No pertinent surgical history.  OB History   No obstetric history on file.      Home Medications    Prior to Admission medications   Medication Sig Start Date End Date Taking? Authorizing Provider  triamcinolone cream (KENALOG) 0.1 % Apply 1 application topically 2 (two) times daily. Apply Eucerin original healing cream after each application of triamcinolone has completely absorbed into the skin. 02/21/21  Yes Lynden Oxford Scales, PA-C  albuterol (VENTOLIN HFA) 108 (90 Base) MCG/ACT inhaler INHALE 1-2 PUFFS INTO THE  LUNGS EVERY 6 HOURS AS NEEDED FOR WHEEZING OR SHORTNESS OF BREATH. 06/19/20 06/19/21  Jaynee Eagles, PA-C  cetirizine (ZYRTEC) 10 MG tablet TAKE 1 TABLET BY MOUTH ONCE A DAY 06/19/20 06/19/21  Jaynee Eagles, PA-C  danazol (DANOCRINE) 200 MG capsule 200 mg. 10/20/18   [provider]  danazol (DANOCRINE) 200 MG capsule TAKE 1 CAPSULE BY MOUTH TWICE DAILY AS NEEDED 01/29/20 01/28/21  Governor Specking, MD  letrozole [facility]) 2.5 MG tablet 2.5 mg. 10/20/18   [provider]  letrozole [facility]) 2.5 MG tablet Take 1 tablet (2.5 mg total) by mouth daily until start of PIO 02/07/21     naproxen sodium (ALEVE) 220 MG tablet Take 440 mg 2 (two) times daily as needed by mouth.    [provider]    Family History Family History  Problem Relation Age of Onset   Hypertension Mother    Diabetes Mother    Hypertension Father    Diabetes Father     Social History Social History   Tobacco Use   Smoking status: Never   Smokeless tobacco: Never  Vaping Use   Vaping Use: Never used  Substance Use Topics   Alcohol use: No   Drug use: No     Allergies   Patient has no known allergies.   Review of Systems Review of Systems Pertinent findings noted in history of present illness.    Physical Exam  Triage Vital Signs ED Triage Vitals  Enc Vitals Group     BP 02/21/21 0827 (!) 147/82     Pulse Rate 02/21/21 0827 72     Resp 02/21/21 0827 18     Temp 02/21/21 0827 98.3 F (36.8 C)     Temp Source 02/21/21 0827 Oral     SpO2 02/21/21 0827 98 %     Weight --      Height --      Head Circumference --      Peak Flow --      Pain Score 02/21/21 0826 5     Pain Loc --      Pain Edu? --      Excl. in [place]? --    No data found.  Updated Vital Signs BP 121/85 (BP Location: Right Arm)   Pulse 83   Temp 98.1 F (36.7 C) (Oral)   Resp 20   LMP  (LMP Unknown) Comment: See medications  SpO2 97%   Visual Acuity Right Eye Distance:   Left Eye Distance:   Bilateral  Distance:    Right Eye Near:   Left Eye Near:    Bilateral Near:     Physical Exam Vitals and nursing note reviewed.  Constitutional:      General: She is not in acute distress.    Appearance: Normal appearance. She is not ill-appearing.  HENT:     Head: Normocephalic and atraumatic.  Eyes:     General: Lids are normal.        Right eye: No discharge.        Left eye: No discharge.     Extraocular Movements: Extraocular movements intact.     Conjunctiva/sclera: Conjunctivae normal.     Right eye: Right conjunctiva is not injected.     Left eye: Left conjunctiva is not injected.  Neck:     Trachea: Trachea and phonation normal.  Cardiovascular:     Rate and Rhythm: Normal rate and regular rhythm.     Pulses: Normal pulses.     Heart sounds: Normal heart sounds. No murmur heard.   No friction rub. No gallop.  Pulmonary:     Effort: Pulmonary effort is normal. No accessory muscle usage, prolonged expiration or respiratory distress.     Breath sounds: Normal breath sounds. No stridor, decreased air movement or transmitted upper airway sounds. No decreased breath sounds, wheezing, rhonchi or rales.  Chest:     Chest wall: No tenderness.  Musculoskeletal:        General: Normal range of motion.     Cervical back: Normal range of motion and neck supple. Normal range of motion.     Comments: Erythematous maculopapular rash on the back of both hands without scale, skin is dry to touch, no concern for excoriation or superficial infection.  Lymphadenopathy:     Cervical: No cervical adenopathy.  Skin:    General: Skin is warm and dry.     Findings: Rash (TNTC maculopapular lesions on back of both hands with mild excoriation, light scale, no sign of superficial infection.) present. No erythema.  Neurological:     General: No focal deficit present.     Mental Status: She is alert and oriented to person, place, and time.  Psychiatric:        Mood and Affect: Mood normal.         Behavior: Behavior normal.     UC Treatments / Results  Labs (all labs ordered  are listed, but only abnormal results are displayed) Labs Reviewed - No data to display  EKG   Radiology No results found.  Procedures Procedures (including critical care time)  Medications Ordered in UC Medications - No data to display  Initial Impression / Assessment and Plan / UC Course  I have reviewed the triage vital signs and the nursing notes.  Pertinent labs & imaging results that were available during my care of the patient were reviewed by me and considered in my medical decision making (see chart for details).     Atopy.  Patient provided with a prescription for triamcinolone cream 1% to apply twice daily, patient advised to cover with Eucerin original healing moisture barrier cream with each application of triamcinolone and as often as needed throughout the day, particularly prior to donning gloves and after washing hands.  Return precautions advised.  Patient verbalized understanding and agreement of plan as discussed.  All questions were addressed during visit.  Please see discharge instructions below for further details of plan.  Final Clinical Impressions(s) / UC Diagnoses   Final diagnoses:  Atopic dermatitis, unspecified type     Discharge Instructions      I believe that your rash on your hands is what we will call atopic dermatitis and its related to either the frequent handwashing or recent change in the handwashing detergent provided to you at your job.  The treatment for this is a topical steroid cream, triamcinolone.  Please apply this to the affected areas of your skin twice daily.  Once it is completely absorbed, please cover the same areas with Eucerin original healing cream.  You can also reapply the Eucerin cream as often as you like throughout the day, specially after washing your hands.     ED Prescriptions     Medication Sig Dispense Auth. Provider    triamcinolone cream (KENALOG) 0.1 % Apply 1 application topically 2 (two) times daily. Apply Eucerin original healing cream after each application of triamcinolone has completely absorbed into the skin. 80 g Lynden Oxford Scales, PA-C      PDMP not reviewed this encounter.    Lynden Oxford Scales, [place] 02/24/21 [phone]

## 2021-02-21 NOTE — ED Notes (Addendum)
This RN checked on patient in lobby, denies SOB, symptoms x 2 days, stable at this time

## 2021-02-21 NOTE — Discharge Instructions (Addendum)
I believe that your rash on your hands is what we will call atopic dermatitis and its related to either the frequent handwashing or recent change in the handwashing detergent provided to you at your job.  The treatment for this is a topical steroid cream, triamcinolone.  Please apply this to the affected areas of your skin twice daily.  Once it is completely absorbed, please cover the same areas with Eucerin original healing cream.  You can also reapply the Eucerin cream as often as you like throughout the day, specially after washing your hands.

## 2021-02-21 NOTE — ED Triage Notes (Signed)
Pt reports having a rash to her hands for the last few days. Pt denies SOB.

## 2021-02-22 ENCOUNTER — Other Ambulatory Visit (HOSPITAL_COMMUNITY): Payer: Self-pay

## 2021-03-18 ENCOUNTER — Other Ambulatory Visit: Payer: Self-pay

## 2021-03-18 ENCOUNTER — Other Ambulatory Visit: Payer: Self-pay | Admitting: Medical

## 2021-03-18 NOTE — Telephone Encounter (Signed)
Gynecology usually fill this. Are you going to take over refill?

## 2021-03-19 ENCOUNTER — Other Ambulatory Visit (HOSPITAL_COMMUNITY): Payer: Self-pay

## 2021-03-24 ENCOUNTER — Ambulatory Visit: Payer: No Typology Code available for payment source | Admitting: Medical

## 2021-03-25 ENCOUNTER — Ambulatory Visit: Payer: No Typology Code available for payment source | Admitting: Medical

## 2021-03-27 ENCOUNTER — Other Ambulatory Visit (HOSPITAL_COMMUNITY): Payer: Self-pay

## 2021-04-02 ENCOUNTER — Other Ambulatory Visit (HOSPITAL_COMMUNITY): Payer: Self-pay

## 2021-04-04 ENCOUNTER — Other Ambulatory Visit (HOSPITAL_COMMUNITY): Payer: Self-pay

## 2021-04-07 ENCOUNTER — Other Ambulatory Visit (HOSPITAL_COMMUNITY): Payer: Self-pay

## 2021-04-07 ENCOUNTER — Encounter: Payer: No Typology Code available for payment source | Admitting: Medical

## 2021-04-07 VITALS — BP 139/97 | HR 80 | Resp 18 | Ht 60.0 in | Wt 238.6 lb

## 2021-04-07 LAB — POCT URINALYSIS DIPSTICK
Bilirubin, UA: NEGATIVE
Blood, UA: NEGATIVE
Glucose, UA: NEGATIVE
Ketones, UA: NEGATIVE
Leukocytes, UA: NEGATIVE
Nitrite, UA: NEGATIVE
Protein, UA: NEGATIVE
Spec Grav, UA: <=1.005 — AB (ref 1.010–1.025)
Urobilinogen, UA: 0.2 E.U./dL
pH, UA: 6.5 (ref 5.0–8.0)

## 2021-04-07 LAB — POCT URINE PREGNANCY: Preg Test, Ur: NEGATIVE

## 2021-04-11 ENCOUNTER — Other Ambulatory Visit (HOSPITAL_COMMUNITY): Payer: Self-pay

## 2021-04-14 ENCOUNTER — Encounter: Payer: Self-pay | Admitting: Medical

## 2021-04-14 ENCOUNTER — Ambulatory Visit (INDEPENDENT_AMBULATORY_CARE_PROVIDER_SITE_OTHER): Payer: No Typology Code available for payment source | Admitting: Medical

## 2021-04-14 VITALS — BP 120/85 | HR 86 | Temp 97.9°F | Resp 18 | Ht 60.0 in | Wt 240.0 lb

## 2021-04-14 DIAGNOSIS — E118 Type 2 diabetes mellitus with unspecified complications: Secondary | ICD-10-CM | POA: Diagnosis not present

## 2021-04-14 DIAGNOSIS — G5603 Carpal tunnel syndrome, bilateral upper limbs: Secondary | ICD-10-CM

## 2021-04-14 DIAGNOSIS — N979 Female infertility, unspecified: Secondary | ICD-10-CM

## 2021-04-14 DIAGNOSIS — D219 Benign neoplasm of connective and other soft tissue, unspecified: Secondary | ICD-10-CM | POA: Diagnosis not present

## 2021-04-14 DIAGNOSIS — Z124 Encounter for screening for malignant neoplasm of cervix: Secondary | ICD-10-CM

## 2021-04-14 NOTE — Patient Instructions (Addendum)
CTS that has been severe and present for more than a year.  Continue to follow-up with sports medicine for repeat injections.  I filled out your FMLA form today.  Form was faxed and we gave you a copy.  Diabetes with no recent A1c.  You have not been taking metformin.  We will get T8C and metabolic panel today.  Determine if you need to be back on medication.  Recommend low sugar diet and daily exercise.  Infertility with absence of menstrual cycle for 2 years.  Placed referral to gynecologist for evaluation, treatment and Pap smear.  Blood pressure was initially high but on recheck blood pressure was controlled.  Follow-up in 3 months or sooner if needed.

## 2021-04-14 NOTE — Progress Notes (Signed)
Subjective:    Patient ID: Lori Gillespie, female    DOB: 1990-07-30, 30 y.o.   MRN: [id number]  HPI Pt in for to fill out fmla forms but also has some health maintenance concerns.   Pt has hx of abnormal uterine bleedings and has some fibroids. Pt has dx of fertility. Pt has work up done in past. She was on both danazol and letrozol. Last week pregnancy test was negative.   Pt has gynecologist that is no fertility expert.   Pt has controlled diabetes type II. Pt has not been taking metformin.    Pt has cts. Pt is still seeing sports med MD.       Review of Systems  Constitutional:  Negative for chills, fatigue and fever.  HENT:  Negative for congestion, ear discharge, ear pain and facial swelling.   Respiratory:  Negative for cough, chest tightness, shortness of breath and wheezing.   Cardiovascular:  Negative for chest pain and palpitations.  Gastrointestinal:  Negative for abdominal pain, blood in stool, diarrhea and nausea.  Genitourinary:  Negative for difficulty urinating, dysuria, flank pain, genital sores, pelvic pain and urgency.  Musculoskeletal:  Negative for back pain and joint swelling.  Skin:  Negative for rash.    Past Medical History:  Diagnosis Date   Abnormal uterine bleeding    Abnormal uterine bleeding    Anemia    Fibroids    PCOS (polycystic ovarian syndrome)      Social History   Socioeconomic History   Marital status: Married    Spouse name: Not on file   Number of children: Not on file   Years of education: Not on file   Highest education level: Not on file  Occupational History   Not on file  Tobacco Use   Smoking status: Never   Smokeless tobacco: Never  Vaping Use   Vaping Use: Never used  Substance and Sexual Activity   Alcohol use: No   Drug use: No   Sexual activity: Not on file  Other Topics Concern   Not on file  Social History Narrative   Not on file   Social Determinants of Health   Financial Resource Strain:  Not on file  Food Insecurity: Not on file  Transportation Needs: Not on file  Physical Activity: Not on file  Stress: Not on file  Social Connections: Not on file  Intimate Partner Violence: Not on file    No past surgical history on file.  Family History  Problem Relation Age of Onset   Hypertension Mother    Diabetes Mother    Hypertension Father    Diabetes Father     No Known Allergies  Current Outpatient Medications on File Prior to Visit  Medication Sig Dispense Refill   albuterol (VENTOLIN HFA) 108 (90 Base) MCG/ACT inhaler INHALE 1-2 PUFFS INTO THE LUNGS EVERY 6 HOURS AS NEEDED FOR WHEEZING OR SHORTNESS OF BREATH. 18 g 0   cetirizine (ZYRTEC) 10 MG tablet TAKE 1 TABLET BY MOUTH ONCE A DAY 90 tablet 0   danazol (DANOCRINE) 200 MG capsule 200 mg.     danazol (DANOCRINE) 200 MG capsule TAKE 1 CAPSULE BY MOUTH TWICE DAILY AS NEEDED 60 capsule 11   letrozole (FEMARA) 2.5 MG tablet 2.5 mg.     letrozole (FEMARA) 2.5 MG tablet Take 1 tablet (2.5 mg total) by mouth daily until start of PIO 30 tablet 1   naproxen sodium (ALEVE) 220 MG tablet Take 440 mg 2 (two)  times daily as needed by mouth.     triamcinolone cream (KENALOG) 0.1 % Apply 1 application topically 2 (two) times daily. Apply Eucerin original healing cream after each application of triamcinolone has completely absorbed into the skin. 80 g 0   No current facility-administered medications on file prior to visit.    BP (!) 144/4    Pulse 86    Temp 97.9 F (36.6 C)    Resp 18    Ht 5' (1.524 m)    Wt 240 lb (108.9 kg)    SpO2 100%    BMI 46.87 kg/m   120/85 bp on recheck.    Objective:   Physical Exam  General Mental Status- Alert. General Appearance- Not in acute distress.   Skin General: Color- Normal Color. Moisture- Normal Moisture.  Neck Carotid Arteries- Normal color. Moisture- Normal Moisture. No carotid bruits. No JVD.  Chest and Lung Exam Auscultation: Breath  Sounds:-Normal.  Cardiovascular Auscultation:Rythm- Regular. Murmurs & Other Heart Sounds:Auscultation of the heart reveals- No Murmurs.  Abdomen Inspection:-Inspeection Normal. Palpation/Percussion:Note:No mass. Palpation and Percussion of the abdomen reveal- Non Tender, Non Distended + BS, no rebound or guarding.   Neurologic Cranial Nerve exam:- CN III-XII intact(No nystagmus), symmetric smile. Strength:- 5/5 equal and symmetric strength both upper and lower extremities.       Assessment & Plan:   Patient Instructions  CTS that has been severe and present for more than a year.  Continue to follow-up with sports medicine for repeat injections.  I filled out your FMLA form today.  Form was faxed and we gave you a copy.  Diabetes with no recent A1c.  You have not been taking metformin.  We will get H3S and metabolic panel today.  Determine if you need to be back on medication.  Recommend low sugar diet and daily exercise.  Infertility with absence of menstrual cycle for 2 years.  Placed referral to gynecologist for evaluation, treatment and Pap smear.  Blood pressure was initially high but on recheck blood pressure was controlled.  Follow-up in 3 months or sooner if needed.   Time spent with patient today was 40  minutes which consisted of chart review, discussing diagnosis, work up treatment, filling out multipage fmla forme and documentation.

## 2021-04-15 LAB — COMPREHENSIVE METABOLIC PANEL
ALT: 42 U/L — ABNORMAL HIGH (ref 0–35)
AST: 23 U/L (ref 0–37)
Albumin: 4 g/dL (ref 3.5–5.2)
Alkaline Phosphatase: 89 U/L (ref 39–117)
BUN: 9 mg/dL (ref 6–23)
CO2: 28 mEq/L (ref 19–32)
Calcium: 8.8 mg/dL (ref 8.4–10.5)
Chloride: 103 mEq/L (ref 96–112)
Creatinine, Ser: 0.45 mg/dL (ref 0.40–1.20)
GFR: 128.46 mL/min (ref >60.00)
Glucose, Bld: 111 mg/dL — ABNORMAL HIGH (ref 70–99)
Potassium: 4 mEq/L (ref 3.5–5.1)
Sodium: 137 mEq/L (ref 135–145)
Total Bilirubin: 0.3 mg/dL (ref 0.2–1.2)
Total Protein: 6.8 g/dL (ref 6.0–8.3)

## 2021-04-15 LAB — HEMOGLOBIN A1C: Hgb A1c MFr Bld: 6.5 % (ref 4.6–6.5)

## 2021-04-15 MED ORDER — METFORMIN HCL 500 MG PO TABS
500.0000 mg | ORAL_TABLET | Freq: Two times a day (BID) | ORAL | 3 refills | Status: DC
  Filled 2021-04-15 – 2021-04-28 (×2): qty 180, 90d supply, fill #0

## 2021-04-15 NOTE — Addendum Note (Signed)
Addended by: Anabel Halon on: 04/15/2021 06:55 PM   Modules accepted: Orders

## 2021-04-16 ENCOUNTER — Other Ambulatory Visit (HOSPITAL_COMMUNITY): Payer: Self-pay

## 2021-04-22 ENCOUNTER — Ambulatory Visit: Payer: No Typology Code available for payment source | Admitting: Family Medicine

## 2021-04-23 ENCOUNTER — Other Ambulatory Visit (HOSPITAL_COMMUNITY): Payer: Self-pay

## 2021-04-23 ENCOUNTER — Encounter: Payer: Self-pay | Admitting: Medical

## 2021-04-23 DIAGNOSIS — Z0279 Encounter for issue of other medical certificate: Secondary | ICD-10-CM

## 2021-04-23 NOTE — Telephone Encounter (Signed)
Dates covered copied from [facility] paperwork on 12/19  Form faxed , and also charge sheet placed in Jennifer's office

## 2021-04-23 NOTE — Telephone Encounter (Signed)
Form placed in red folder  

## 2021-04-24 ENCOUNTER — Other Ambulatory Visit (HOSPITAL_COMMUNITY): Payer: Self-pay

## 2021-04-28 ENCOUNTER — Encounter: Payer: Self-pay | Admitting: Medical

## 2021-04-28 ENCOUNTER — Other Ambulatory Visit (HOSPITAL_COMMUNITY): Payer: Self-pay

## 2021-04-28 ENCOUNTER — Other Ambulatory Visit: Payer: Self-pay | Admitting: Medical

## 2021-04-29 ENCOUNTER — Telehealth (INDEPENDENT_AMBULATORY_CARE_PROVIDER_SITE_OTHER): Payer: No Typology Code available for payment source | Admitting: Medical

## 2021-04-29 DIAGNOSIS — G5601 Carpal tunnel syndrome, right upper limb: Secondary | ICD-10-CM

## 2021-04-29 NOTE — Patient Instructions (Signed)
CTS- presently stable but flares on occasion and missed work. Last days missed 04-22-21 thru 04-29-2021. I correct accomadation request form. Will have staff fax correct form after lunch.  Follow up as regularly scheduled with sports med for CTS.

## 2021-04-29 NOTE — Progress Notes (Signed)
° °  Subjective:    Patient ID: Lori Gillespie, female    DOB: May 04, 1990, 31 y.o.   MRN: [id number]  HPI Virtual Visit via Telephone Note  I connected with Lori Gillespie on 04/29/21 at 11:20 AM EST by telephone and verified that I am speaking with the correct person using two identifiers.  Location: Patient: home Provider: office   I discussed the limitations, risks, security and privacy concerns of performing an evaluation and management service by telephone and the availability of in person appointments. I also discussed with the patient that there may be a patient responsible charge related to this service. The patient expressed understanding and agreed to proceed.   History of Present Illness: Pt has history of missing work. We filled out form again. Reviewed form with pt today. Looks like date for continuous time off needed to be changed. Dated were 04-17-2021 to end date 04-29-2021.  She had missed work due to cts flare and missed appointment with sports med MD.  On review of that note appears she was getting depomedrol injections    Observations/Objective: Genera-no acute distress. Normal speech.  Assessment and Plan: Patient Instructions  CTS- presently stable but flares on occasion and missed work. Last days missed 04-22-21 thru 04-29-2021. I correct accomadation request form. Will have staff fax correct form after lunch.  Follow up as regularly scheduled with sports med for CTS.   Mackie Pai, PA-C   Follow Up Instructions:    I discussed the assessment and treatment plan with the patient. The patient was provided an opportunity to ask questions and all were answered. The patient agreed with the plan and demonstrated an understanding of the instructions.   The patient was advised to call back or seek an in-person evaluation if the symptoms worsen or if the condition fails to improve as anticipated.     Mackie Pai, PA-C   Time spent with patient today  was 10  minutes which consisted of chart revdiew, discussing diagnosis, work up treatment and documentation.   Review of Systems  Musculoskeletal:        Tingling when cts flares  Neurological:        Tingling when cts fares      Objective:   Physical Exam        Assessment & Plan:

## 2021-04-30 ENCOUNTER — Ambulatory Visit: Payer: No Typology Code available for payment source | Admitting: Medical

## 2021-05-03 ENCOUNTER — Other Ambulatory Visit (HOSPITAL_COMMUNITY): Payer: Self-pay

## 2021-05-07 ENCOUNTER — Other Ambulatory Visit (HOSPITAL_COMMUNITY): Payer: Self-pay

## 2021-06-12 ENCOUNTER — Encounter: Payer: No Typology Code available for payment source | Admitting: Family Medicine

## 2021-06-13 ENCOUNTER — Encounter: Payer: Self-pay | Admitting: Family Medicine

## 2021-06-16 ENCOUNTER — Ambulatory Visit: Payer: Self-pay | Admitting: Family Medicine

## 2021-06-17 ENCOUNTER — Other Ambulatory Visit (HOSPITAL_COMMUNITY): Payer: Self-pay

## 2021-06-17 ENCOUNTER — Other Ambulatory Visit: Payer: Self-pay

## 2021-06-19 ENCOUNTER — Ambulatory Visit (INDEPENDENT_AMBULATORY_CARE_PROVIDER_SITE_OTHER): Payer: Self-pay | Admitting: Family Medicine

## 2021-06-19 ENCOUNTER — Ambulatory Visit: Payer: Self-pay

## 2021-06-19 ENCOUNTER — Encounter: Payer: Self-pay | Admitting: Family Medicine

## 2021-06-19 VITALS — BP 146/108 | Ht 60.0 in | Wt 240.0 lb

## 2021-06-19 DIAGNOSIS — G5601 Carpal tunnel syndrome, right upper limb: Secondary | ICD-10-CM

## 2021-06-19 MED ORDER — METHYLPREDNISOLONE ACETATE 40 MG/ML IJ SUSP
40.0000 mg | Freq: Once | INTRAMUSCULAR | Status: AC
  Administered 2021-06-19: 40 mg via INTRA_ARTICULAR

## 2021-06-19 NOTE — Patient Instructions (Signed)
Good to see you Please let me know if you want to pursue a carpal tunnel release going forward  Please send me a message in MyChart with any questions or updates.  Please see me back as needed.   --Dr. Raeford Razor

## 2021-06-19 NOTE — Progress Notes (Signed)
°  Lori Gillespie - 31 y.o. female MRN [id number]  Date of birth: 05-Jan-1991  SUBJECTIVE:  Including CC & ROS.  No chief complaint on file.   Lori Gillespie is a 31 y.o. female that is presenting with acute on chronic right carpal tunnel syndrome.  Her median nerve within the carpal tunnel is a measure to be significant restriction.  She has gotten some improvement with her symptoms but they have recently exacerbated.   Review of Systems See HPI   HISTORY: Past Medical, Surgical, Social, and Family History Reviewed & Updated per EMR.   Pertinent Historical Findings include:  Past Medical History:  Diagnosis Date   Abnormal uterine bleeding    Abnormal uterine bleeding    Anemia    Fibroids    PCOS (polycystic ovarian syndrome)     History reviewed. No pertinent surgical history.   PHYSICAL EXAM:  VS: BP (!) 146/108 (BP Location: Left Arm, Patient Position: Sitting)    Ht 5' (1.524 m)    Wt 240 lb (108.9 kg)    BMI 46.87 kg/m  Physical Exam Gen: NAD, alert, cooperative with exam, well-appearing MSK:  Neurovascularly intact    Limited ultrasound: Right wrist:  Median nerve measures 0.17 cm within the carpal tunnel  Summary: Ongoing carpal tunnel disease  Ultrasound and interpretation by Clearance Coots, MD   Aspiration/Injection Procedure Note Lori Gillespie [birth year]  Procedure: Injection Indications: Right carpal tunnel syndrome  Procedure Details Consent: Risks of procedure as well as the alternatives and risks of each were explained to the (patient/caregiver).  Consent for procedure obtained. Time Out: Verified patient identification, verified procedure, site/side was marked, verified correct patient position, special equipment/implants available, medications/allergies/relevent history reviewed, required imaging and test results available.  Performed.  The area was cleaned with iodine and alcohol swabs.    The right carpal tunnel was injected using 1  cc's of 40 mg Depo-Medrol and 1 cc's of 0.25% bupivacaine with a 25 1 1/2" needle.  Ultrasound was used. Images were obtained in long views showing the injection.     A sterile dressing was applied.  Patient did tolerate procedure well.    ASSESSMENT & PLAN:   Carpal tunnel syndrome of right wrist Acute exacerbation of her underlying carpal tunnel.  The median nerve is still measuring significantly thick. -Counseled on home exercise therapy and supportive care. -Injection today. -Could consider sending for release going forward.

## 2021-06-19 NOTE — Assessment & Plan Note (Signed)
Acute exacerbation of her underlying carpal tunnel.  The median nerve is still measuring significantly thick. -Counseled on home exercise therapy and supportive care. -Injection today. -Could consider sending for release going forward.

## 2021-07-03 ENCOUNTER — Other Ambulatory Visit (HOSPITAL_COMMUNITY): Payer: Self-pay

## 2021-08-01 ENCOUNTER — Other Ambulatory Visit (HOSPITAL_COMMUNITY): Payer: Self-pay

## 2021-08-15 ENCOUNTER — Other Ambulatory Visit (HOSPITAL_COMMUNITY): Payer: Self-pay

## 2021-08-22 ENCOUNTER — Encounter: Payer: Self-pay | Admitting: Family Medicine

## 2021-08-26 ENCOUNTER — Encounter: Payer: Self-pay | Admitting: General Practice

## 2021-08-26 ENCOUNTER — Ambulatory Visit: Payer: Self-pay | Admitting: Medical

## 2021-09-06 ENCOUNTER — Other Ambulatory Visit (HOSPITAL_COMMUNITY): Payer: Self-pay

## 2021-09-08 ENCOUNTER — Encounter: Payer: Self-pay | Admitting: Medical

## 2021-09-08 ENCOUNTER — Other Ambulatory Visit (HOSPITAL_COMMUNITY): Payer: Self-pay

## 2021-09-11 ENCOUNTER — Telehealth: Payer: Self-pay | Admitting: Medical

## 2021-09-15 ENCOUNTER — Ambulatory Visit: Payer: Self-pay | Admitting: Family Medicine

## 2021-09-15 ENCOUNTER — Ambulatory Visit: Payer: Self-pay | Admitting: Medical

## 2021-09-16 ENCOUNTER — Encounter: Payer: Self-pay | Admitting: Family Medicine

## 2021-09-16 ENCOUNTER — Ambulatory Visit (INDEPENDENT_AMBULATORY_CARE_PROVIDER_SITE_OTHER): Payer: Self-pay | Admitting: Medical

## 2021-09-16 ENCOUNTER — Ambulatory Visit: Payer: Self-pay

## 2021-09-16 ENCOUNTER — Ambulatory Visit (INDEPENDENT_AMBULATORY_CARE_PROVIDER_SITE_OTHER): Payer: Self-pay | Admitting: Family Medicine

## 2021-09-16 VITALS — BP 139/77 | HR 87 | Resp 18 | Ht 60.0 in | Wt 242.0 lb

## 2021-09-16 VITALS — BP 130/94 | Ht 60.0 in | Wt 240.0 lb

## 2021-09-16 DIAGNOSIS — G5603 Carpal tunnel syndrome, bilateral upper limbs: Secondary | ICD-10-CM

## 2021-09-16 DIAGNOSIS — Z30019 Encounter for initial prescription of contraceptives, unspecified: Secondary | ICD-10-CM

## 2021-09-16 DIAGNOSIS — G5601 Carpal tunnel syndrome, right upper limb: Secondary | ICD-10-CM

## 2021-09-16 DIAGNOSIS — N920 Excessive and frequent menstruation with regular cycle: Secondary | ICD-10-CM

## 2021-09-16 LAB — POCT URINE PREGNANCY: Preg Test, Ur: NEGATIVE

## 2021-09-16 MED ORDER — FLUTICASONE PROPIONATE 50 MCG/ACT NA SUSP: 2.0000 | Freq: Every day | NASAL | 1 refills | Status: DC

## 2021-09-16 MED ORDER — BETAMETHASONE SOD PHOS & ACET 6 (3-3) MG/ML IJ SUSP
6.0000 mg | Freq: Once | INTRAMUSCULAR | Status: AC
  Administered 2021-09-16: 6 mg via INTRA_ARTICULAR

## 2021-09-16 MED ORDER — METFORMIN HCL 500 MG PO TABS: 500.0000 mg | ORAL_TABLET | Freq: Two times a day (BID) | ORAL | 3 refills | Status: DC

## 2021-09-16 MED ORDER — ALBUTEROL SULFATE HFA 108 (90 BASE) MCG/ACT IN AERS: 2.0000 | INHALATION_SPRAY | Freq: Four times a day (QID) | RESPIRATORY_TRACT | 0 refills | Status: DC | PRN

## 2021-09-16 MED ORDER — NORGESTIM-ETH ESTRAD TRIPHASIC 0.18/0.215/0.25 MG-35 MCG PO TABS: 1.0000 | ORAL_TABLET | Freq: Every day | ORAL | 11 refills | Status: DC

## 2021-09-16 MED ORDER — LEVOCETIRIZINE DIHYDROCHLORIDE 5 MG PO TABS: 5.0000 mg | ORAL_TABLET | Freq: Every evening | ORAL | 3 refills | Status: DC

## 2021-09-16 NOTE — Patient Instructions (Signed)
Good to see you Please adjust your work as needed  Please send me a message in [place] with any questions or updates.  Please see me back in 3 months or as needed if better.   --Dr. Raeford Razor

## 2021-09-16 NOTE — Progress Notes (Signed)
Lori Gillespie - 31 y.o. female MRN [id number]  Date of birth: 1991/01/29  SUBJECTIVE:  Including CC & ROS.  No chief complaint on file.   Lori Gillespie is a 31 y.o. female that is presenting with acute on chronic right carpal tunnel syndrome and acute left carpal tunnel syndrome.  She has been working at the [employer] and doing repetitive activity.  Her symptoms seem to be worse in the morning.  No injury or inciting event.   Review of Systems See HPI   HISTORY: Past Medical, Surgical, Social, and Family History Reviewed & Updated per EMR.   Pertinent Historical Findings include:  Past Medical History:  Diagnosis Date   Abnormal uterine bleeding    Abnormal uterine bleeding    Anemia    Fibroids    PCOS (polycystic ovarian syndrome)     History reviewed. No pertinent surgical history.   PHYSICAL EXAM:  VS: BP (!) 130/94 (BP Location: Left Arm, Patient Position: Sitting)   Ht 5' (1.524 m)   Wt 240 lb (108.9 kg)   BMI 46.87 kg/m  Physical Exam Gen: NAD, alert, cooperative with exam, well-appearing MSK:  Neurovascularly intact    Limited ultrasound: Right and left wrist:  The right wrist median nerve measures 0.19 cm in the left wrist median nerve measures 0.14 cm.  Summary: Findings consistent with bilateral carpal tunnel syndrome.  Ultrasound and interpretation by Clearance Coots, MD   Aspiration/Injection Procedure Note Lori Gillespie 23-Jan-1991  Procedure: Injection Indications: Right carpal tunnel syndrome  Procedure Details Consent: Risks of procedure as well as the alternatives and risks of each were explained to the (patient/caregiver).  Consent for procedure obtained. Time Out: Verified patient identification, verified procedure, site/side was marked, verified correct patient position, special equipment/implants available, medications/allergies/relevent history reviewed, required imaging and test results available.  Performed.  The area was  cleaned with iodine and alcohol swabs.    The right median nerve and carpal tunnel was injected using 1 cc's of 6 mg betamethasone, 2 cc of normal saline, 2 cc of D5W and 2 cc's of 0.5% bupivacaine with a 25 1 1/2" needle.  Ultrasound was used. Images were obtained in long views showing the injection.     A sterile dressing was applied.  Patient did tolerate procedure well.  Aspiration/Injection Procedure Note Lori Gillespie [birth year]  Procedure: Injection Indications: Left carpal tunnel syndrome  Procedure Details Consent: Risks of procedure as well as the alternatives and risks of each were explained to the (patient/caregiver).  Consent for procedure obtained. Time Out: Verified patient identification, verified procedure, site/side was marked, verified correct patient position, special equipment/implants available, medications/allergies/relevent history reviewed, required imaging and test results available.  Performed.  The area was cleaned with iodine and alcohol swabs.    The Left median nerve and carpal tunnel was injected using 1 cc's of 6 mg betamethasone, 2 cc of normal saline, 2 cc of D5W and 2 cc's of 0.5% bupivacaine with a 25 1 1/2" needle.  Ultrasound was used. Images were obtained in long views showing the injection.     A sterile dressing was applied.  Patient did tolerate procedure well.   ASSESSMENT & PLAN:   Bilateral carpal tunnel syndrome Acutely occurring.  The right hand is acute on chronic in nature and has significant changes of the median nerve.  The left wrist is more acutely occurring. -Counseled on home exercise therapy and supportive care. -Bilateral injections today. -She would like to consider surgery later this year -Provided  work note.

## 2021-09-16 NOTE — Progress Notes (Signed)
Subjective:    Patient ID: Lori Gillespie, female    DOB: 01/15/1991, 31 y.o.   MRN: [id number]  HPI Pt in for follow up.  Pt had sent request to get on ocp.   Pt has history of heavy cycles typically lasting for one week. In past she became anemic with heavy menses.   Pt was undergoing fertility treatments more than a year ago.   Pt is working for [employer].   Pt last menses end of April. Pt wants to restart birth control to help control heavy menses.  On review pt not feeling fatigued. Pt does note that this April menses was first menses had for years as she was on danazol and letrozole.    Recent pnd, nasal congestion and feels like starting to get dry cough with mild wheeze. Symptoms started 2 weeks ago. Hx of spring allergies and asthma a child.   Review of Systems  Constitutional:  Negative for chills, fatigue and fever.  Respiratory:  Negative for chest tightness, shortness of breath and wheezing.   Cardiovascular:  Negative for chest pain and palpitations.  Gastrointestinal:  Negative for abdominal pain and blood in stool.  Genitourinary:  Positive for menstrual problem.  Musculoskeletal:  Negative for back pain and myalgias.  Skin:  Negative for rash.  Hematological:  Negative for adenopathy. Does not bruise/bleed easily.  Psychiatric/Behavioral:  Negative for behavioral problems, confusion, decreased concentration and dysphoric mood.     Past Medical History:  Diagnosis Date   Abnormal uterine bleeding    Abnormal uterine bleeding    Anemia    Fibroids    PCOS (polycystic ovarian syndrome)      Social History   Socioeconomic History   Marital status: Married    Spouse name: Not on file   Number of children: Not on file   Years of education: Not on file   Highest education level: Not on file  Occupational History   Not on file  Tobacco Use   Smoking status: Never   Smokeless tobacco: Never  Vaping Use   Vaping Use: Never used  Substance and Sexual  Activity   Alcohol use: No   Drug use: No   Sexual activity: Not on file  Other Topics Concern   Not on file  Social History Narrative   Not on file   Social Determinants of Health   Financial Resource Strain: Not on file  Food Insecurity: Not on file  Transportation Needs: Not on file  Physical Activity: Not on file  Stress: Not on file  Social Connections: Not on file  Intimate Partner Violence: Not on file    No past surgical history on file.  Family History  Problem Relation Age of Onset   Hypertension Mother    Diabetes Mother    Hypertension Father    Diabetes Father     No Known Allergies  Current Outpatient Medications on File Prior to Visit  Medication Sig Dispense Refill   letrozole (FEMARA) 2.5 MG tablet Take 1 tablet (2.5 mg total) by mouth daily until start of PIO 30 tablet 1   metFORMIN (GLUCOPHAGE) 500 MG tablet Take 1 tablet (500 mg total) by mouth 2 (two) times daily with a meal. 180 tablet 3   naproxen sodium (ALEVE) 220 MG tablet Take 440 mg 2 (two) times daily as needed by mouth.     triamcinolone cream (KENALOG) 0.1 % Apply 1 application topically 2 (two) times daily. Apply Eucerin original healing cream after each  application of triamcinolone has completely absorbed into the skin. 80 g 0   albuterol (VENTOLIN HFA) 108 (90 Base) MCG/ACT inhaler INHALE 1-2 PUFFS INTO THE LUNGS EVERY 6 HOURS AS NEEDED FOR WHEEZING OR SHORTNESS OF BREATH. 18 g 0   cetirizine (ZYRTEC) 10 MG tablet TAKE 1 TABLET BY MOUTH ONCE A DAY 90 tablet 0   danazol (DANOCRINE) 200 MG capsule TAKE 1 CAPSULE BY MOUTH TWICE DAILY AS NEEDED 60 capsule 11   No current facility-administered medications on file prior to visit.    BP 139/77   Pulse 87   Resp 18   Ht 5' (1.524 m)   Wt 242 lb (109.8 kg)   SpO2 97%   BMI 47.26 kg/m       Objective:   Physical Exam  General- No acute distress. Pleasant patient. Neck- Full range of motion, no jvd Lungs- Clear, even and  unlabored. Heart- regular rate and rhythm. Neurologic- CNII- XII grossly intact.  Heent- pnd and boggy turbinates.     Assessment & Plan:   Patient Instructions  Heavy menses with history of infertility and anemia. Only one recent cycle in April. Pregnancy test negative today.  Rx ortho tri- cyclen contraceptive.  For allergic rhinitis rx xyzal 5 mg daily. Rx flonase nasal spray.  For early onset wheezing rx albuterol inhaler.  Follow up in 2 weeks or sooner if needed   [employer], PA-C

## 2021-09-16 NOTE — Assessment & Plan Note (Addendum)
Acutely occurring.  The right hand is acute on chronic in nature and has significant changes of the median nerve.  The left wrist is more acutely occurring. -Counseled on home exercise therapy and supportive care. -Bilateral injections today. -She would like to consider surgery later this year -Provided work note.

## 2021-09-16 NOTE — Patient Instructions (Addendum)
Heavy menses with history of infertility and anemia. Only one recent cycle in April. Pregnancy test negative today.  Rx ortho tri- cyclen contraceptive.  For allergic rhinitis rx xyzal 5 mg daily. Rx flonase nasal spray.  For early onset wheezing rx albuterol inhaler.  For diabetes will get a1c and cmp. Off of metformin for 2 or more months. Will refill med today.  Follow up in 2 weeks or sooner if needed

## 2021-09-25 ENCOUNTER — Encounter: Payer: Self-pay | Admitting: Medical

## 2021-10-01 ENCOUNTER — Ambulatory Visit (INDEPENDENT_AMBULATORY_CARE_PROVIDER_SITE_OTHER): Payer: BLUE CROSS/BLUE SHIELD | Admitting: Medical

## 2021-10-01 ENCOUNTER — Encounter: Payer: Self-pay | Admitting: Medical

## 2021-10-01 VITALS — BP 130/80 | HR 86 | Resp 18 | Ht 60.0 in | Wt 241.0 lb

## 2021-10-01 DIAGNOSIS — E118 Type 2 diabetes mellitus with unspecified complications: Secondary | ICD-10-CM | POA: Diagnosis not present

## 2021-10-01 LAB — COMPREHENSIVE METABOLIC PANEL
ALT: 30 U/L (ref 0–35)
AST: 16 U/L (ref 0–37)
Albumin: 4 g/dL (ref 3.5–5.2)
Alkaline Phosphatase: 83 U/L (ref 39–117)
BUN: 10 mg/dL (ref 6–23)
CO2: 25 mEq/L (ref 19–32)
Calcium: 9.2 mg/dL (ref 8.4–10.5)
Chloride: 103 mEq/L (ref 96–112)
Creatinine, Ser: 0.5 mg/dL (ref 0.40–1.20)
GFR: 124.84 mL/min (ref >60.00)
Glucose, Bld: 105 mg/dL — ABNORMAL HIGH (ref 70–99)
Potassium: 4.3 mEq/L (ref 3.5–5.1)
Sodium: 138 mEq/L (ref 135–145)
Total Bilirubin: 0.5 mg/dL (ref 0.2–1.2)
Total Protein: 6.6 g/dL (ref 6.0–8.3)

## 2021-10-01 LAB — HEMOGLOBIN A1C: Hgb A1c MFr Bld: 6.9 % — ABNORMAL HIGH (ref 4.6–6.5)

## 2021-10-01 MED ORDER — METHYLPREDNISOLONE 4 MG PO TABS: ORAL_TABLET | ORAL | 0 refills | Status: DC

## 2021-10-01 NOTE — Patient Instructions (Addendum)
Allergic rhinitis despite xyzal and flonase. Adding short taper low dose medrol. If you have persisting symptoms then would add on montelukast.  Diabetes with a1c 6.5 5 months ago. Continue metformin 500 mg twice daily and low sugar diet. Check sugars daily particularly while on medrol. If any sugars over 200 let me know as steroid  can increase sugar. Rx advisement given.  Get cmp and a1c today.  Follow up 3-6 months diabetic check or sooner if needed.

## 2021-10-01 NOTE — Progress Notes (Signed)
Subjective:    Patient ID: Lori Gillespie, female    DOB: 1991/01/25, 31 y.o.   MRN: [id number]  HPI Pt in for recent nasal congestion, runny nose and dry cough.  Cough is dry. Pt feels some pnd.   Pt recently working for [employer]. 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   Pt on last visit had mentioned.  "Recent pnd, nasal congestion and feels like starting to get dry cough with mild wheeze. Symptoms started 2 weeks ago. Hx of spring allergies and asthma a child."  Last visit I  rx'd xyzal 5 mg daily. Rx flonase nasal spray.  Pt wonders if needs steroid injection.  Pt is diabetic. She does not have glucometer. Last a1c 6.5. on metformin 500 mg twice daily.      Review of Systems  Constitutional:  Negative for chills, fatigue and fever.  HENT:  Positive for congestion and postnasal drip. Negative for sinus pressure and sinus pain.   Respiratory:  Positive for cough. Negative for shortness of breath and wheezing.   Cardiovascular:  Negative for chest pain and palpitations.  Gastrointestinal:  Negative for abdominal pain.  Endocrine: Negative for polydipsia, polyphagia and polyuria.  Genitourinary:  Negative for dysuria.  Musculoskeletal:  Negative for back pain.  Skin:  Negative for rash.  Neurological:  Negative for dizziness, light-headedness and headaches.  Psychiatric/Behavioral:  Negative for behavioral problems and decreased concentration.     Past Medical History:  Diagnosis Date   Abnormal uterine bleeding    Abnormal uterine bleeding    Anemia    Fibroids    PCOS (polycystic ovarian syndrome)      Social History   Socioeconomic History   Marital status: Married    Spouse name: Not on file   Number of children: Not on file   Years of education: Not on file   Highest education level: Not on file  Occupational History   Not on file  Tobacco Use   Smoking status: Never   Smokeless  tobacco: Never  Vaping Use   Vaping Use: Never used  Substance and Sexual Activity   Alcohol use: No   Drug use: No   Sexual activity: Not on file  Other Topics Concern   Not on file  Social History Narrative   Not on file   Social Determinants of Health   Financial Resource Strain: Not on file  Food Insecurity: Not on file  Transportation Needs: Not on file  Physical Activity: Not on file  Stress: Not on file  Social Connections: Not on file  Intimate Partner Violence: Not on file    No past surgical history on file.  Family History  Problem Relation Age of Onset   Hypertension Mother    Diabetes Mother    Hypertension Father    Diabetes Father     No Known Allergies  Current Outpatient Medications on File Prior to Visit  Medication Sig Dispense Refill   albuterol (VENTOLIN HFA) 108 (90 Base) MCG/ACT inhaler Inhale 2 puffs into the lungs every 6 (six) hours as needed. 18 g 0   fluticasone (FLONASE) 50 MCG/ACT nasal spray Place 2 sprays into both nostrils daily. 16 g 1   letrozole (FEMARA) 2.5 MG tablet Take 1 tablet (2.5 mg total) by mouth daily until start of PIO 30 tablet 1   levocetirizine (XYZAL) 5 MG tablet Take 1 tablet (5 mg total) by mouth every evening. [place]  tablet 3   metFORMIN (GLUCOPHAGE) 500 MG tablet Take 1 tablet (500 mg total) by mouth 2 (two) times daily with a meal. 180 tablet 3   naproxen sodium (ALEVE) 220 MG tablet Take 440 mg 2 (two) times daily as needed by mouth.     Norgestimate-Ethinyl Estradiol Triphasic (ORTHO TRI-CYCLEN, 28,) 0.18/0.215/0.25 MG-35 MCG tablet Take 1 tablet by mouth daily. 28 tablet 11   triamcinolone cream (KENALOG) 0.1 % Apply 1 application topically 2 (two) times daily. Apply Eucerin original healing cream after each application of triamcinolone has completely absorbed into the skin. 80 g 0   albuterol (VENTOLIN HFA) 108 (90 Base) MCG/ACT inhaler INHALE 1-2 PUFFS INTO THE LUNGS EVERY 6 HOURS AS NEEDED FOR WHEEZING OR SHORTNESS  OF BREATH. 18 g 0   cetirizine (ZYRTEC) 10 MG tablet TAKE 1 TABLET BY MOUTH ONCE A DAY 90 tablet 0   danazol (DANOCRINE) 200 MG capsule TAKE 1 CAPSULE BY MOUTH TWICE DAILY AS NEEDED 60 capsule 11   No current facility-administered medications on file prior to visit.    BP (!) 146/90   Pulse 86   Resp 18   Ht 5' (1.524 m)   Wt 241 lb (109.3 kg)   SpO2 97%   BMI 47.07 kg/m       Objective:   Physical Exam  General Mental Status- Alert. General Appearance- Not in acute distress.   Skin General: Color- Normal Color. Moisture- Normal Moisture.  Neck Carotid Arteries- Normal color. Moisture- Normal Moisture. No carotid bruits. No JVD.  Chest and Lung Exam Auscultation: Breath Sounds:-Normal.  Cardiovascular Auscultation:Rythm- Regular. Murmurs & Other Heart Sounds:Auscultation of the heart reveals- No Murmurs.  Abdomen Inspection:-Inspeection Normal. Palpation/Percussion:Note:No mass. Palpation and Percussion of the abdomen reveal- Non Tender, Non Distended + BS, no rebound or guarding.   Neurologic Cranial Nerve exam:- CN III-XII intact(No nystagmus), symmetric smile. Strength:- 5/5 equal and symmetric strength both upper and lower extremities.    Heent- boggy turbinates. +pnd. No sinus pressure.    Assessment & Plan:   Patient Instructions  Allergic rhinitis despite xyzal and flonase. Adding short taper low dose medrol. If you have persisting symptoms then would add on montelukast.  Diabetes with a1c 6.5. Continue metformin 500 mg twice daily and low sugar diet. Check sugars daily particularly while on medrol. If any sugars over 200 let me know as steroid  can increase sugar. Rx advisement given.  Get cmp and a1c today.  Follow up 3-6 months diabetic check or sooner if needed.

## 2021-10-13 MED ORDER — NORGESTIM-ETH ESTRAD TRIPHASIC 0.18/0.215/0.25 MG-35 MCG PO TABS: 1.0000 | ORAL_TABLET | Freq: Every day | ORAL | 11 refills | Status: DC

## 2021-10-15 ENCOUNTER — Other Ambulatory Visit: Payer: Self-pay | Admitting: Medical

## 2021-10-31 ENCOUNTER — Telehealth (INDEPENDENT_AMBULATORY_CARE_PROVIDER_SITE_OTHER): Payer: BLUE CROSS/BLUE SHIELD | Admitting: Family

## 2021-10-31 ENCOUNTER — Other Ambulatory Visit: Payer: Self-pay | Admitting: Family

## 2021-10-31 DIAGNOSIS — J069 Acute upper respiratory infection, unspecified: Secondary | ICD-10-CM | POA: Diagnosis not present

## 2021-10-31 MED ORDER — BENZONATATE 200 MG PO CAPS
200.0000 mg | ORAL_CAPSULE | Freq: Three times a day (TID) | ORAL | 0 refills | Status: DC | PRN
Start: 2021-10-31 — End: 2021-12-25

## 2021-10-31 NOTE — Telephone Encounter (Signed)
Pt called and placed on Laura's schedule for VV

## 2021-10-31 NOTE — Progress Notes (Signed)
Lori Gillespie is a 31 y.o. female with the following history as recorded in Epic[employer]:  Patient Active Problem List   Diagnosis Date Noted   Bilateral carpal tunnel syndrome 07/30/2020   Symptomatic anemia    Abnormal uterine bleeding (AUB) 03/05/2017    Current Outpatient Medications  Medication Sig Dispense Refill   benzonatate (TESSALON) 200 MG capsule Take 1 capsule (200 mg total) by mouth 3 (three) times daily as needed for cough. 21 capsule 0   albuterol (VENTOLIN HFA) 108 (90 Base) MCG/ACT inhaler INHALE 1-2 PUFFS INTO THE LUNGS EVERY 6 HOURS AS NEEDED FOR WHEEZING OR SHORTNESS OF BREATH. 18 g 0   albuterol (VENTOLIN HFA) 108 (90 Base) MCG/ACT inhaler TAKE 2 PUFFS BY MOUTH EVERY 6 HOURS AS NEEDED 18 each 2   cetirizine (ZYRTEC) 10 MG tablet TAKE 1 TABLET BY MOUTH ONCE A DAY 90 tablet 0   danazol (DANOCRINE) 200 MG capsule TAKE 1 CAPSULE BY MOUTH TWICE DAILY AS NEEDED 60 capsule 11   fluticasone (FLONASE) 50 MCG/ACT nasal spray Place 2 sprays into both nostrils daily. 16 g 1   letrozole (FEMARA) 2.5 MG tablet Take 1 tablet (2.5 mg total) by mouth daily until start of PIO 30 tablet 1   levocetirizine (XYZAL) 5 MG tablet Take 1 tablet (5 mg total) by mouth every evening. 30 tablet 3   metFORMIN (GLUCOPHAGE) 500 MG tablet Take 1 tablet (500 mg total) by mouth 2 (two) times daily with a meal. 180 tablet 3   naproxen sodium (ALEVE) 220 MG tablet Take 440 mg 2 (two) times daily as needed by mouth.     Norgestimate-Ethinyl Estradiol Triphasic (ORTHO TRI-CYCLEN, 28,) 0.18/0.215/0.25 MG-35 MCG tablet Take 1 tablet by mouth daily. 28 tablet 11   triamcinolone cream (KENALOG) 0.1 % Apply 1 application topically 2 (two) times daily. Apply Eucerin original healing cream after each application of triamcinolone has completely absorbed into the skin. 80 g 0   No current facility-administered medications for this visit.    Allergies: Patient has no known allergies.  Past Medical History:   Diagnosis Date   Abnormal uterine bleeding    Abnormal uterine bleeding    Anemia    Fibroids    PCOS (polycystic ovarian syndrome)     No past surgical history on file.  Family History  Problem Relation Age of Onset   Hypertension Mother    Diabetes Mother    Hypertension Father    Diabetes Father     Social History   Tobacco Use   Smoking status: Never   Smokeless tobacco: Never  Substance Use Topics   Alcohol use: No    Subjective:    I connected with Kieley Liddell on 10/31/21 at 11:00 AM EDT by a telephone call and verified that I am speaking with the correct person using two identifiers.   I discussed the limitations of evaluation and management by telemedicine and the availability of in person appointments. The patient expressed understanding and agreed to proceed.   Provider in office/ patient is at home; provider and patient are only 2 people on telephone call.   Started yesterday with cough/ congestion; feels feverish; no chest pain or shortness of breath; is concerned that OTC Alka-Seltzer is not working; also requesting work note for yesterday and today and upcoming weekend ( works at [employer]);    Objective:  There were no vitals filed for this visit.  Lungs: Respirations unlabored;  Neurologic: Alert and oriented; speech intact;   Assessment:  1. Viral URI with cough     Plan:  Encouraged her to take at home COVID test; Rx for [facility]; use albuterol inhaler as needed and continue allergy medications; work note given as requested; increase fluids, rest and follow up worse, no better.   Time spent 10 minutes  No follow-ups on file.  No orders of the defined types were placed in this encounter.   Requested Prescriptions   Signed Prescriptions Disp Refills   benzonatate (TESSALON) 200 MG capsule 21 capsule 0    Sig: Take 1 capsule (200 mg total) by mouth 3 (three) times daily as needed for cough.

## 2021-11-03 ENCOUNTER — Ambulatory Visit: Payer: BLUE CROSS/BLUE SHIELD | Admitting: Medical

## 2021-11-03 VITALS — BP 130/80 | HR 83 | Resp 18 | Ht 60.0 in | Wt 235.8 lb

## 2021-11-03 DIAGNOSIS — M542 Cervicalgia: Secondary | ICD-10-CM | POA: Diagnosis not present

## 2021-11-03 DIAGNOSIS — G4489 Other headache syndrome: Secondary | ICD-10-CM

## 2021-11-03 DIAGNOSIS — R1110 Vomiting, unspecified: Secondary | ICD-10-CM | POA: Diagnosis not present

## 2021-11-03 LAB — POCT URINE PREGNANCY: Preg Test, Ur: NEGATIVE

## 2021-11-03 MED ORDER — ONDANSETRON HCL 4 MG PO TABS: 4.0000 mg | ORAL_TABLET | Freq: Three times a day (TID) | ORAL | 0 refills | Status: DC | PRN

## 2021-11-03 MED ORDER — KETOROLAC TROMETHAMINE 60 MG/2ML IM SOLN
60.0000 mg | Freq: Once | INTRAMUSCULAR | Status: AC
  Administered 2021-11-03: 60 mg via INTRAMUSCULAR

## 2021-11-03 MED ORDER — CYCLOBENZAPRINE HCL 5 MG PO TABS: 5.0000 mg | ORAL_TABLET | Freq: Every day | ORAL | 0 refills | Status: DC

## 2021-11-03 NOTE — Progress Notes (Signed)
Subjective:    Patient ID: Lori Gillespie, female    DOB: 11-08-1990, 31 y.o.   MRN: [id number]  HPI  Pt in with 4-5 days of ha and some neck pain. She states this still has ha after her cold like symptoms. No neck pain. This morning pain level is 5/10. Some times pain level 8/10. No light sensitivity or sound sensitivity. No gross motor or sensory function deficits.   2 days ago she vomited.   Pt was seen for uri with cough type symptoms about 3 days. Pt tx with ben zonate and was given inhaler. Pt was told to continue allergy med. Pt stats nasal congestion is better. No fever. Had mild ha at time seen on virtual visit. Pt still has some upper back pain. Pt took covid test on Thursday and was negative. No recent significant cough.  Lmp- 3 weeks ago. Pt on ocp.    Review of Systems  Constitutional:  Negative for chills, fatigue and fever.  Respiratory:  Negative for cough, chest tightness and wheezing.   Cardiovascular:  Negative for chest pain and palpitations.  Gastrointestinal:  Positive for vomiting. Negative for abdominal pain.  Skin:  Negative for rash.  Neurological:  Positive for headaches. Negative for dizziness, syncope, weakness and numbness.  Hematological:  Negative for adenopathy. Does not bruise/bleed easily.  Psychiatric/Behavioral:  Negative for behavioral problems and confusion.     Past Medical History:  Diagnosis Date   Abnormal uterine bleeding    Abnormal uterine bleeding    Anemia    Fibroids    PCOS (polycystic ovarian syndrome)      Social History   Socioeconomic History   Marital status: Married    Spouse name: Not on file   Number of children: Not on file   Years of education: Not on file   Highest education level: Not on file  Occupational History   Not on file  Tobacco Use   Smoking status: Never   Smokeless tobacco: Never  Vaping Use   Vaping Use: Never used  Substance and Sexual Activity   Alcohol use: No   Drug use: No    Sexual activity: Not on file  Other Topics Concern   Not on file  Social History Narrative   Not on file   Social Determinants of Health   Financial Resource Strain: Not on file  Food Insecurity: Not on file  Transportation Needs: Not on file  Physical Activity: Not on file  Stress: Not on file  Social Connections: Not on file  Intimate Partner Violence: Not on file    No past surgical history on file.  Family History  Problem Relation Age of Onset   Hypertension Mother    Diabetes Mother    Hypertension Father    Diabetes Father     No Known Allergies  Current Outpatient Medications on File Prior to Visit  Medication Sig Dispense Refill   albuterol (VENTOLIN HFA) 108 (90 Base) MCG/ACT inhaler TAKE 2 PUFFS BY MOUTH EVERY 6 HOURS AS NEEDED 18 each 2   benzonatate (TESSALON) 200 MG capsule Take 1 capsule (200 mg total) by mouth 3 (three) times daily as needed for cough. 21 capsule 0   fluticasone (FLONASE) 50 MCG/ACT nasal spray Place 2 sprays into both nostrils daily. 16 g 1   letrozole (FEMARA) 2.5 MG tablet Take 1 tablet (2.5 mg total) by mouth daily until start of PIO 30 tablet 1   levocetirizine (XYZAL) 5 MG tablet Take 1  tablet (5 mg total) by mouth every evening. 30 tablet 3   metFORMIN (GLUCOPHAGE) 500 MG tablet Take 1 tablet (500 mg total) by mouth 2 (two) times daily with a meal. 180 tablet 3   naproxen sodium (ALEVE) 220 MG tablet Take 440 mg 2 (two) times daily as needed by mouth.     Norgestimate-Ethinyl Estradiol Triphasic (ORTHO TRI-CYCLEN, 28,) 0.18/0.215/0.25 MG-35 MCG tablet Take 1 tablet by mouth daily. 28 tablet 11   triamcinolone cream (KENALOG) 0.1 % Apply 1 application topically 2 (two) times daily. Apply Eucerin original healing cream after each application of triamcinolone has completely absorbed into the skin. 80 g 0   albuterol (VENTOLIN HFA) 108 (90 Base) MCG/ACT inhaler INHALE 1-2 PUFFS INTO THE LUNGS EVERY 6 HOURS AS NEEDED FOR WHEEZING OR SHORTNESS  OF BREATH. 18 g 0   cetirizine (ZYRTEC) 10 MG tablet TAKE 1 TABLET BY MOUTH ONCE A DAY 90 tablet 0   danazol (DANOCRINE) 200 MG capsule TAKE 1 CAPSULE BY MOUTH TWICE DAILY AS NEEDED 60 capsule 11   No current facility-administered medications on file prior to visit.    BP 130/80   Pulse 83   Resp 18   Ht 5' (1.524 m)   Wt 235 lb 12.8 oz (107 kg)   SpO2 99%   BMI 46.05 kg/m        Objective:   Physical Exam  General Mental Status- Alert. General Appearance- Not in acute distress.   Skin General: Color- Normal Color. Moisture- Normal Moisture.  Neck Bilateral trapezius tenderness to palpation. No mid cervical spine tenderness.  Chest and Lung Exam Auscultation: Breath Sounds:-Normal.  Cardiovascular Auscultation:Rythm- Regular. Murmurs & Other Heart Sounds:Auscultation of the heart reveals- No Murmurs.  Abdomen Inspection:-Inspeection Normal. Palpation/Percussion:Note:No mass. Palpation and Percussion of the abdomen reveal- Non Tender, Non Distended + BS, no rebound or guarding.    Neurologic Cranial Nerve exam:- CN III-XII intact(No nystagmus), symmetric smile. Drift Test:- No drift. Romberg Exam:- Negative.  Heal to Toe Gait exam:-Normal. Finger to Nose:- Normal/Intact Strength:- 5/5 equal and symmetric strength both upper and lower extremities.       Assessment & Plan:   Patient Instructions  Recent moderate to severe headache at times with described neck soreness/pain.  However on exam no neck stiffness and normal neurologic exam.  Trapezius muscle do appear tender to palpation.  Considering tension headache.  We will give Toradol 60 mg IM in the office and prescription of Flexeril 5 mg to use 1 tablet at night if needed over the next 4 nights.  Also prescription of Zofran for nausea and vomiting.  Pregnancy test came back negative.  If your headache worsens with neurologic type signs and symptoms or motor deficits then be seen in the emergency  department.  Presently discussed I do not think imaging studies indicated.  Starting tomorrow if headache decreased but still present to some degree then can use ibuprofen 200 to 400 mg every 8 hours.  Also discussed if you start to get any neck stiffness.  You had to leave normal range of motion daily no stiffness at all.  If you were to develop stiffness neck then would recommend ED evaluation for meningitis work-up.  Note I do think that you have recovered from majority of recent upper respiratory infection type signs/symptoms.  Follow-up in 7 to 10 days or sooner if needed.   Mackie Pai, PA-C

## 2021-11-03 NOTE — Patient Instructions (Signed)
Recent moderate to severe headache at times with described neck soreness/pain.  However on exam no neck stiffness and normal neurologic exam.  Trapezius muscle do appear tender to palpation.  Considering tension headache.  We will give Toradol 60 mg IM in the office and prescription of Flexeril 5 mg to use 1 tablet at night if needed over the next 4 nights.  Also prescription of Zofran for nausea and vomiting.  Pregnancy test came back negative.  If your headache worsens with neurologic type signs and symptoms or motor deficits then be seen in the emergency department.  Presently discussed I do not think imaging studies indicated.  Starting tomorrow if headache decreased but still present to some degree then can use ibuprofen 200 to 400 mg every 8 hours.  Also discussed if you start to get any neck stiffness.  You had to leave normal range of motion daily no stiffness at all.  If you were to develop stiffness neck then would recommend ED evaluation for meningitis work-up.  Note I do think that you have recovered from majority of recent upper respiratory infection type signs/symptoms.  Follow-up in 7 to 10 days or sooner if needed.

## 2021-11-11 ENCOUNTER — Ambulatory Visit: Payer: BLUE CROSS/BLUE SHIELD | Admitting: Medical

## 2021-11-20 ENCOUNTER — Encounter: Payer: Self-pay | Admitting: Medical

## 2021-11-20 ENCOUNTER — Ambulatory Visit (INDEPENDENT_AMBULATORY_CARE_PROVIDER_SITE_OTHER): Payer: BLUE CROSS/BLUE SHIELD | Admitting: Medical

## 2021-11-20 VITALS — BP 138/80 | HR 96 | Resp 18 | Ht 60.0 in | Wt 237.6 lb

## 2021-11-20 DIAGNOSIS — M545 Low back pain, unspecified: Secondary | ICD-10-CM

## 2021-11-20 MED ORDER — CYCLOBENZAPRINE HCL 5 MG PO TABS: ORAL_TABLET | ORAL | 0 refills | Status: DC

## 2021-11-20 NOTE — Progress Notes (Signed)
Subjective:    Patient ID: Lori Gillespie, female    DOB: Sep 09, 1990, 31 y.o.   MRN: [id number]  HPI  Pt in for low back pain. Pain started on Sunday . Pt thinks related to lifting. Pt works for [employer]. She states most of thinks are light but occaionally will lift things 25-50 lb. She thinks uses correct lifting technique. Not report twisting motion and leaning over will cause pain. No pain shooting to legs. No leg weakness. No incontinence.  Pt is driving/delivery.  Pt is taking alleve or tylenol for the pain.  Pt works on Saturday. She is off tomorrow.  Presently she is not having pain.   Pain was intermittently yesterday while working. Since Sunday pain level 7-8/10 intermittently.  Review of Systems  Constitutional:  Negative for chills, fatigue and fever.  Respiratory:  Negative for cough, chest tightness, shortness of breath and wheezing.   Cardiovascular:  Negative for chest pain and palpitations.  Gastrointestinal:  Negative for abdominal pain.  Genitourinary:  Negative for dyspareunia, dysuria and flank pain.  Musculoskeletal:  Positive for back pain. Negative for joint swelling, myalgias and neck stiffness.  Skin:  Negative for rash.  Neurological:  Negative for dizziness, syncope, speech difficulty, weakness, numbness and headaches.  Hematological:  Negative for adenopathy. Does not bruise/bleed easily.  Psychiatric/Behavioral:  Negative for behavioral problems, confusion and dysphoric mood.    Past Medical History:  Diagnosis Date   Abnormal uterine bleeding    Abnormal uterine bleeding    Anemia    Fibroids    PCOS (polycystic ovarian syndrome)      Social History   Socioeconomic History   Marital status: Married    Spouse name: Not on file   Number of children: Not on file   Years of education: Not on file   Highest education level: Not on file  Occupational History   Not on file  Tobacco Use   Smoking status: Never   Smokeless tobacco: Never   Vaping Use   Vaping Use: Never used  Substance and Sexual Activity   Alcohol use: No   Drug use: No   Sexual activity: Not on file  Other Topics Concern   Not on file  Social History Narrative   Not on file   Social Determinants of Health   Financial Resource Strain: Not on file  Food Insecurity: Not on file  Transportation Needs: Not on file  Physical Activity: Not on file  Stress: Not on file  Social Connections: Not on file  Intimate Partner Violence: Not on file    No past surgical history on file.  Family History  Problem Relation Age of Onset   Hypertension Mother    Diabetes Mother    Hypertension Father    Diabetes Father     No Known Allergies  Current Outpatient Medications on File Prior to Visit  Medication Sig Dispense Refill   albuterol (VENTOLIN HFA) 108 (90 Base) MCG/ACT inhaler TAKE 2 PUFFS BY MOUTH EVERY 6 HOURS AS NEEDED 18 each 2   benzonatate (TESSALON) 200 MG capsule Take 1 capsule (200 mg total) by mouth 3 (three) times daily as needed for cough. 21 capsule 0   fluticasone (FLONASE) 50 MCG/ACT nasal spray Place 2 sprays into both nostrils daily. 16 g 1   letrozole (FEMARA) 2.5 MG tablet Take 1 tablet (2.5 mg total) by mouth daily until start of PIO 30 tablet 1   levocetirizine (XYZAL) 5 MG tablet Take 1 tablet (5 mg  total) by mouth every evening. 30 tablet 3   metFORMIN (GLUCOPHAGE) 500 MG tablet Take 1 tablet (500 mg total) by mouth 2 (two) times daily with a meal. 180 tablet 3   naproxen sodium (ALEVE) 220 MG tablet Take 440 mg 2 (two) times daily as needed by mouth.     Norgestimate-Ethinyl Estradiol Triphasic (ORTHO TRI-CYCLEN, 28,) 0.18/0.215/0.25 MG-35 MCG tablet Take 1 tablet by mouth daily. 28 tablet 11   ondansetron (ZOFRAN) 4 MG tablet Take 1 tablet (4 mg total) by mouth every 8 (eight) hours as needed for nausea or vomiting. 20 tablet 0   triamcinolone cream (KENALOG) 0.1 % Apply 1 application topically 2 (two) times daily. Apply Eucerin  original healing cream after each application of triamcinolone has completely absorbed into the skin. 80 g 0   albuterol (VENTOLIN HFA) 108 (90 Base) MCG/ACT inhaler INHALE 1-2 PUFFS INTO THE LUNGS EVERY 6 HOURS AS NEEDED FOR WHEEZING OR SHORTNESS OF BREATH. 18 g 0   cetirizine (ZYRTEC) 10 MG tablet TAKE 1 TABLET BY MOUTH ONCE A DAY 90 tablet 0   danazol (DANOCRINE) 200 MG capsule TAKE 1 CAPSULE BY MOUTH TWICE DAILY AS NEEDED 60 capsule 11   No current facility-administered medications on file prior to visit.    BP 138/80   Pulse 96   Resp 18   Ht 5' (1.524 m)   Wt 237 lb 9.6 oz (107.8 kg)   SpO2 98%   BMI 46.40 kg/m           Objective:   Physical Exam  General Appearance- Not in acute distress.    Chest and Lung Exam Auscultation: Breath sounds:-Normal. Clear even and unlabored. Adventitious sounds:- No Adventitious sounds.  Cardiovascular Auscultation:Rythm - Regular, rate and rythm. Heart Sounds -Normal heart sounds.  Abdomen Inspection:-Inspection Normal.  Palpation/Perucssion: Palpation and Percussion of the abdomen reveal- Non Tender, No Rebound tenderness, No rigidity(Guarding) and No Palpable abdominal masses.  Liver:-Normal.  Spleen:- Normal.   Back Mid lumbar spine tenderness to palpation. No pain on straight leg lift. Pain on lateral movements and flexion/extension of the spine.  Lower ext neurologic  L5-S1 sensation intact bilaterally. Normal patellar reflexes bilaterally. No foot drop bilaterally.       Assessment & Plan:   Patient Instructions  Back pain new onset associated with lifting. Recommend trying to get support belt to use at work as needed. Continue to use proper lifting technique.   Toradol 30 mg IM today.   Use low dose ibuprofen in combination with tylenol for pain.(But hold on ibuprofen use until tomorrow am)  Flexeril 5 mg tab to use at night for back pain or muscle spasma.  If back pain persists into next week despite  the above consider lumbar xray.  Back exercises as tolerated.  Follow up in 10 days or sooner if needed   [employer], PA-C

## 2021-11-20 NOTE — Patient Instructions (Signed)
Back pain new onset associated with lifting. Recommend trying to get support belt to use at work as needed. Continue to use proper lifting technique.   Toradol 30 mg IM today.   Use low dose ibuprofen in combination with tylenol for pain.(But hold on ibuprofen use until tomorrow am)  Flexeril 5 mg tab to use at night for back pain or muscle spasma.  If back pain persists into next week despite the above consider lumbar xray.  Back exercises as tolerated.  Follow up in 10 days or sooner if needed

## 2021-11-21 ENCOUNTER — Encounter: Payer: Self-pay | Admitting: Medical

## 2021-11-27 ENCOUNTER — Encounter: Payer: BLUE CROSS/BLUE SHIELD | Admitting: Medical

## 2021-12-03 ENCOUNTER — Encounter (INDEPENDENT_AMBULATORY_CARE_PROVIDER_SITE_OTHER): Payer: Self-pay

## 2021-12-05 ENCOUNTER — Ambulatory Visit: Payer: BLUE CROSS/BLUE SHIELD | Admitting: Medical

## 2021-12-19 ENCOUNTER — Telehealth: Payer: Self-pay | Admitting: Physician Assistant

## 2021-12-19 DIAGNOSIS — A084 Viral intestinal infection, unspecified: Secondary | ICD-10-CM

## 2021-12-19 MED ORDER — ONDANSETRON 4 MG PO TBDP: 4.0000 mg | ORAL_TABLET | Freq: Three times a day (TID) | ORAL | 0 refills | Status: DC | PRN

## 2021-12-19 NOTE — Patient Instructions (Signed)
Claudine Hanners, thank you for joining Mar Daring, PA-C for today's virtual visit.  While this provider is not your primary care provider (PCP), if your PCP is located in our provider database this encounter information will be shared with them immediately following your visit.  Consent: (Patient) Lori Gillespie provided verbal consent for this virtual visit at the beginning of the encounter.  Current Medications:  Current Outpatient Medications:    ondansetron (ZOFRAN-ODT) 4 MG disintegrating tablet, Take 1 tablet (4 mg total) by mouth every 8 (eight) hours as needed for nausea or vomiting., Disp: 20 tablet, Rfl: 0   albuterol (VENTOLIN HFA) 108 (90 Base) MCG/ACT inhaler, INHALE 1-2 PUFFS INTO THE LUNGS EVERY 6 HOURS AS NEEDED FOR WHEEZING OR SHORTNESS OF BREATH., Disp: 18 g, Rfl: 0   albuterol (VENTOLIN HFA) 108 (90 Base) MCG/ACT inhaler, TAKE 2 PUFFS BY MOUTH EVERY 6 HOURS AS NEEDED, Disp: 18 each, Rfl: 2   benzonatate (TESSALON) 200 MG capsule, Take 1 capsule (200 mg total) by mouth 3 (three) times daily as needed for cough., Disp: 21 capsule, Rfl: 0   cetirizine (ZYRTEC) 10 MG tablet, TAKE 1 TABLET BY MOUTH ONCE A DAY, Disp: 90 tablet, Rfl: 0   cyclobenzaprine (FLEXERIL) 5 MG tablet, 1 tab po prn back pain prior to sleep., Disp: 7 tablet, Rfl: 0   danazol (DANOCRINE) 200 MG capsule, TAKE 1 CAPSULE BY MOUTH TWICE DAILY AS NEEDED, Disp: 60 capsule, Rfl: 11   fluticasone (FLONASE) 50 MCG/ACT nasal spray, Place 2 sprays into both nostrils daily., Disp: 16 g, Rfl: 1   letrozole (FEMARA) 2.5 MG tablet, Take 1 tablet (2.5 mg total) by mouth daily until start of PIO, Disp: 30 tablet, Rfl: 1   levocetirizine (XYZAL) 5 MG tablet, Take 1 tablet (5 mg total) by mouth every evening., Disp: 30 tablet, Rfl: 3   metFORMIN (GLUCOPHAGE) 500 MG tablet, Take 1 tablet (500 mg total) by mouth 2 (two) times daily with a meal., Disp: 180 tablet, Rfl: 3   naproxen sodium (ALEVE) 220 MG tablet, Take  440 mg 2 (two) times daily as needed by mouth., Disp: , Rfl:    Norgestimate-Ethinyl Estradiol Triphasic (ORTHO TRI-CYCLEN, 28,) 0.18/0.215/0.25 MG-35 MCG tablet, Take 1 tablet by mouth daily., Disp: 28 tablet, Rfl: 11   triamcinolone cream (KENALOG) 0.1 %, Apply 1 application topically 2 (two) times daily. Apply Eucerin original healing cream after each application of triamcinolone has completely absorbed into the skin., Disp: 80 g, Rfl: 0   Medications ordered in this encounter:  Meds ordered this encounter  Medications   ondansetron (ZOFRAN-ODT) 4 MG disintegrating tablet    Sig: Take 1 tablet (4 mg total) by mouth every 8 (eight) hours as needed for nausea or vomiting.    Dispense:  20 tablet    Refill:  0    Order Specific Question:   Supervising Provider    Answer:   Sabra Heck, BRIAN [[id number]]     *If you need refills on other medications prior to your next appointment, please contact your pharmacy*  Follow-Up: Call back or seek an in-person evaluation if the symptoms worsen or if the condition fails to improve as anticipated.  Other Instructions  Viral Gastroenteritis, Adult  Viral gastroenteritis is also known as the stomach flu. This condition may affect your stomach, your small intestine, and your large intestine. It can cause sudden watery poop (diarrhea), fever, and vomiting. This condition is caused by certain germs (viruses). These germs can be passed from person  to person very easily (are contagious). Having watery poop and vomiting can make you feel weak and cause you to not have enough water in your body (get dehydrated). This can make you tired and thirsty, make you have a dry mouth, and make it so you pee (urinate) less often. It is important to replace the fluids that you lose from having watery poop and vomiting. What are the causes? You can get sick by catching germs from other people. You can also get sick by: Eating food, drinking water, or touching a surface that has  the germs on it (is contaminated). Sharing utensils or other personal items with a person who is sick. What increases the risk? Having a weak body defense system (immune system). Living with one or more children who are younger than 2 years. Living in a nursing home. Going on cruise ships. What are the signs or symptoms? Symptoms of this condition start suddenly. Symptoms may last for a few days or for as long as a week. Common symptoms include: Watery poop. Vomiting. Other symptoms include: Fever. Headache. Feeling tired (fatigue). Pain in the belly (abdomen). Chills. Feeling weak. Feeling like you may vomit (nauseous). Muscle aches. Not feeling hungry. How is this treated? This condition typically goes away on its own. The focus of treatment is to replace the fluids that you lose. This condition may be treated with: An ORS (oral rehydration solution). This is a drink that helps you replace fluids and minerals your body lost. It is sold at pharmacies and stores. Medicines to help with your symptoms. Probiotic supplements to reduce symptoms of watery poop. Fluids given through an IV tube, if needed. Older adults and people with other diseases or a weak body defense system are at higher risk for not having enough water in the body. Follow these instructions at home: Eating and drinking  Take an ORS as told by your doctor. Drink clear fluids in small amounts as you are able. Clear fluids include: Water. Ice chips. Fruit juice that has water added to it (is diluted). Low-calorie sports drinks. Drink enough fluid to keep your pee (urine) pale yellow. Eat small amounts of healthy foods every 3-4 hours as you are able. This may include whole grains, fruits, vegetables, lean meats, and yogurt. Avoid fluids that have a lot of sugar or caffeine in them. This includes energy drinks, sports drinks, and soda. Avoid spicy or fatty foods. Avoid alcohol. General instructions  Wash your  hands often. This is very important after you have watery poop or you vomit. If you cannot use soap and water, use hand sanitizer. Make sure that all people in your home wash their hands well and often. Take over-the-counter and prescription medicines only as told by your doctor. Rest at home while you get better. Watch your condition for any changes. Take a warm bath to help with any burning or pain from having watery poop. Keep all follow-up visits. Contact a doctor if: You cannot keep fluids down. Your symptoms get worse. You have new symptoms. You feel light-headed or dizzy. You have muscle cramps. Get help right away if: You have chest pain. You have trouble breathing, or you are breathing very fast. You have a fast heartbeat. You feel very weak or you faint. You have a very bad headache, a stiff neck, or both. You have a rash. You have very bad pain, cramping, or bloating in your belly. Your skin feels cold and clammy. You feel mixed up (confused). You  have pain when you pee. You have signs of not having enough water in the body, such as: Dark pee, hardly any pee, or no pee. Cracked lips. Dry mouth. Sunken eyes. Feeling very sleepy. Feeling weak. You have signs of bleeding, such as: You see blood in your vomit. Your vomit looks like coffee grounds. You have bloody or black poop or poop that looks like tar. These symptoms may be an emergency. Get help right away. Call 911. Do not wait to see if the symptoms will go away. Do not drive yourself to the hospital. Summary Viral gastroenteritis is also known as the stomach flu. This condition can cause sudden watery poop (diarrhea), fever, and vomiting. These germs can be passed from person to person very easily. Take an ORS (oral rehydration solution) as told by your doctor. This is a drink that is sold at pharmacies and stores. Wash your hands often, especially after having watery poop or vomiting. If you cannot use soap  and water, use hand sanitizer. This information is not intended to replace advice given to you by your health care provider. Make sure you discuss any questions you have with your health care provider. Document Revised: 02/10/2021 Document Reviewed: 02/10/2021 Elsevier Patient Education  [employer].    If you have been instructed to have an in-person evaluation today at a local Urgent Care facility, please use the link below. It will take you to a list of all of our available [facility], including address, phone number and hours of operation. Please do not delay care.  [facility]  If you or a family member do not have a primary care provider, use the link below to schedule a visit and establish care. When you choose a [facility] primary care physician or advanced practice provider, you gain a long-term partner in health. Find a Primary Care Provider  Learn more about [facility]'s in-office and virtual care options: [facility]

## 2021-12-19 NOTE — Progress Notes (Signed)
Virtual Visit Consent   Lori Gillespie, you are scheduled for a virtual visit with a [facility] provider today. Just as with appointments in the office, your consent must be obtained to participate. Your consent will be active for this visit and any virtual visit you may have with one of our providers in the next 365 days. If you have a MyChart account, a copy of this consent can be sent to you electronically.  As this is a virtual visit, video technology does not allow for your provider to perform a traditional examination. This may limit your provider's ability to fully assess your condition. If your provider identifies any concerns that need to be evaluated in person or the need to arrange testing (such as labs, EKG, etc.), we will make arrangements to do so. Although advances in technology are sophisticated, we cannot ensure that it will always work on either your end or our end. If the connection with a video visit is poor, the visit may have to be switched to a telephone visit. With either a video or telephone visit, we are not always able to ensure that we have a secure connection.  By engaging in this virtual visit, you consent to the provision of healthcare and authorize for your insurance to be billed (if applicable) for the services provided during this visit. Depending on your insurance coverage, you may receive a charge related to this service.  I need to obtain your verbal consent now. Are you willing to proceed with your visit today? Lori Gillespie has provided verbal consent on 12/19/2021 for a virtual visit (video or telephone). Lori Daring, PA-C  Date: 12/19/2021 2:03 PM  Virtual Visit via Video Note   IMar Gillespie, connected with  Lori Gillespie  ([id number], 05-29-1990) on 12/19/21 at  2:00 PM EDT by a video-enabled telemedicine application and verified that I am speaking with the correct person using two identifiers.  Location: Patient: Virtual Visit  Location Patient: Home Provider: Virtual Visit Location Provider: Home Office   I discussed the limitations of evaluation and management by telemedicine and the availability of in person appointments. The patient expressed understanding and agreed to proceed.    History of Present Illness: Lori Gillespie is a 31 y.o. who identifies as a female who was assigned female at birth, and is being seen today for nausea/vomiting.  HPI: Emesis  This is a new problem. The current episode started in the past 7 days (wednesday). The problem occurs 2 to 4 times per day. The problem has been gradually worsening. The emesis has an appearance of stomach contents. Maximum temperature: low grade. The fever has been present for Less than 1 day. Associated symptoms include abdominal pain, chills, coughing, diarrhea, dizziness (1st day), a fever, headaches and myalgias. Pertinent negatives include no sweats. She has tried increased fluids (imodium) for the symptoms. The treatment provided no relief.      Problems:  Patient Active Problem List   Diagnosis Date Noted   Bilateral carpal tunnel syndrome 07/30/2020   Symptomatic anemia    Abnormal uterine bleeding (AUB) 03/05/2017    Allergies: No Known Allergies Medications:  Current Outpatient Medications:    ondansetron (ZOFRAN-ODT) 4 MG disintegrating tablet, Take 1 tablet (4 mg total) by mouth every 8 (eight) hours as needed for nausea or vomiting., Disp: 20 tablet, Rfl: 0   albuterol (VENTOLIN HFA) 108 (90 Base) MCG/ACT inhaler, INHALE 1-2 PUFFS INTO THE LUNGS EVERY 6 HOURS AS NEEDED FOR WHEEZING OR SHORTNESS  OF BREATH., Disp: 18 g, Rfl: 0   albuterol (VENTOLIN HFA) 108 (90 Base) MCG/ACT inhaler, TAKE 2 PUFFS BY MOUTH EVERY 6 HOURS AS NEEDED, Disp: 18 each, Rfl: 2   benzonatate (TESSALON) 200 MG capsule, Take 1 capsule (200 mg total) by mouth 3 (three) times daily as needed for cough., Disp: 21 capsule, Rfl: 0   cetirizine (ZYRTEC) 10 MG tablet, TAKE 1  TABLET BY MOUTH ONCE A DAY, Disp: 90 tablet, Rfl: 0   cyclobenzaprine (FLEXERIL) 5 MG tablet, 1 tab po prn back pain prior to sleep., Disp: 7 tablet, Rfl: 0   danazol (DANOCRINE) 200 MG capsule, TAKE 1 CAPSULE BY MOUTH TWICE DAILY AS NEEDED, Disp: 60 capsule, Rfl: 11   fluticasone (FLONASE) 50 MCG/ACT nasal spray, Place 2 sprays into both nostrils daily., Disp: 16 g, Rfl: 1   letrozole (FEMARA) 2.5 MG tablet, Take 1 tablet (2.5 mg total) by mouth daily until start of PIO, Disp: 30 tablet, Rfl: 1   levocetirizine (XYZAL) 5 MG tablet, Take 1 tablet (5 mg total) by mouth every evening., Disp: 30 tablet, Rfl: 3   metFORMIN (GLUCOPHAGE) 500 MG tablet, Take 1 tablet (500 mg total) by mouth 2 (two) times daily with a meal., Disp: 180 tablet, Rfl: 3   naproxen sodium (ALEVE) 220 MG tablet, Take 440 mg 2 (two) times daily as needed by mouth., Disp: , Rfl:    Norgestimate-Ethinyl Estradiol Triphasic (ORTHO TRI-CYCLEN, 28,) 0.18/0.215/0.25 MG-35 MCG tablet, Take 1 tablet by mouth daily., Disp: 28 tablet, Rfl: 11   triamcinolone cream (KENALOG) 0.1 %, Apply 1 application topically 2 (two) times daily. Apply Eucerin original healing cream after each application of triamcinolone has completely absorbed into the skin., Disp: 80 g, Rfl: 0  Observations/Objective: Patient is well-developed, well-nourished in no acute distress.  Resting comfortably at home.  Head is normocephalic, atraumatic.  No labored breathing.  Speech is clear and coherent with logical content.  Patient is alert and oriented at baseline.    Assessment and Plan: 1. Viral gastroenteritis - ondansetron (ZOFRAN-ODT) 4 MG disintegrating tablet; Take 1 tablet (4 mg total) by mouth every 8 (eight) hours as needed for nausea or vomiting.  Dispense: 20 tablet; Refill: 0  - Suspect viral gastroenteritis - Zofran for nausea - Push fluids, electrolyte beverages - Liquid diet, then increase to soft/bland (BRAT) diet over next day, then increase  diet as tolerated - Seek in person evaluation if not improving or symptoms worsen   Follow Up Instructions: I discussed the assessment and treatment plan with the patient. The patient was provided an opportunity to ask questions and all were answered. The patient agreed with the plan and demonstrated an understanding of the instructions.  A copy of instructions were sent to the patient via MyChart unless otherwise noted below.    The patient was advised to call back or seek an in-person evaluation if the symptoms worsen or if the condition fails to improve as anticipated.  Time:  I spent 8 minutes with the patient via telehealth technology discussing the above problems/concerns.    Lori Daring, PA-C

## 2021-12-22 ENCOUNTER — Encounter: Payer: Self-pay | Admitting: Family

## 2021-12-22 ENCOUNTER — Encounter: Payer: Self-pay | Admitting: Physician Assistant

## 2021-12-23 ENCOUNTER — Telehealth: Payer: Self-pay | Admitting: Family

## 2021-12-25 ENCOUNTER — Encounter: Payer: Self-pay | Admitting: Family

## 2021-12-25 ENCOUNTER — Other Ambulatory Visit: Payer: Self-pay | Admitting: Family

## 2021-12-25 ENCOUNTER — Ambulatory Visit (INDEPENDENT_AMBULATORY_CARE_PROVIDER_SITE_OTHER): Payer: Self-pay | Admitting: Family

## 2021-12-25 VITALS — BP 116/82 | HR 87 | Temp 98.1°F | Resp 16 | Ht 60.0 in | Wt 234.4 lb

## 2021-12-25 DIAGNOSIS — R197 Diarrhea, unspecified: Secondary | ICD-10-CM

## 2021-12-25 MED ORDER — CIPROFLOXACIN HCL 500 MG PO TABS: 500.0000 mg | ORAL_TABLET | Freq: Two times a day (BID) | ORAL | 0 refills | Status: AC

## 2021-12-25 NOTE — Patient Instructions (Signed)
Food Choices to Help Relieve Diarrhea, Adult Diarrhea can make you feel weak and cause you to become dehydrated. It is important to choose the right foods and drinks to: Relieve diarrhea. Replace lost fluids and nutrients. Prevent dehydration. What are tips for following this plan? Relieving diarrhea Avoid foods that make your diarrhea worse. These may include: Foods and beverages sweetened with high-fructose corn syrup, honey, or sweeteners such as xylitol, sorbitol, and mannitol. Fried, greasy, or spicy foods. Raw fruits and vegetables. Eat foods that are rich in probiotics. These include foods such as yogurt and fermented milk products. Probiotics can help increase healthy bacteria in your stomach and intestines (gastrointestinal tract or GI tract). This may help digestion and stop diarrhea. If you have lactose intolerance, avoid dairy products. These may make your diarrhea worse. Take medicine to help stop diarrhea only as told by your health care provider. Replacing nutrients  Eat bland, easy-to-digest foods in small amounts as you are able, until your diarrhea starts to get better. These foods include bananas, applesauce, rice, toast, and crackers. Gradually reintroduce nutrient-rich foods as tolerated or as told by your health care provider. This includes: Well-cooked protein foods, such as eggs, lean meats like fish or chicken without skin, and tofu. Peeled, seeded, and soft-cooked fruits and vegetables. Low-fat dairy products. Whole grains. Take vitamin and mineral supplements as told by your health care provider. Preventing dehydration  Start by sipping water or a solution to prevent dehydration (oral rehydration solution, ORS). This is a drink that helps replace fluids and minerals your body has lost. You can buy an ORS at pharmacies and retail stores. Try to drink at least 8-10 cups (2,000-2,500 mL) of fluid each day to help replace lost fluids. If you have urine that is pale  yellow, you are getting enough fluids. You may drink other liquids in addition to water, such as fruit juice that you have added water to (diluted fruit juice) or low-calorie sports drinks, as tolerated or as told by your health care provider. Avoid drinks with caffeine, such as coffee, tea, or soft drinks. Avoid alcohol. Summary When you have diarrhea, it is important to choose the right foods and drinks to relieve diarrhea, to replace lost fluids and nutrients, and to prevent dehydration. Make sure you drink enough fluid to keep your urine pale yellow. You may benefit from eating bland foods at first. Gradually reintroduce healthy, nutrient-rich foods as tolerated or as told by your health care provider. Avoid foods that make your diarrhea worse, such as fried, greasy, or spicy foods. This information is not intended to replace advice given to you by your health care provider. Make sure you discuss any questions you have with your health care provider. Document Revised: 07/04/2021 Document Reviewed: 05/30/2019 Elsevier Patient Education  2023 Elsevier Inc.  

## 2021-12-25 NOTE — Progress Notes (Signed)
Lori Gillespie is a 31 y.o. female with the following history as recorded in EpicCare:  Patient Active Problem List   Diagnosis Date Noted   Bilateral carpal tunnel syndrome 07/30/2020   Symptomatic anemia    Abnormal uterine bleeding (AUB) 03/05/2017    Current Outpatient Medications  Medication Sig Dispense Refill   albuterol (VENTOLIN HFA) 108 (90 Base) MCG/ACT inhaler TAKE 2 PUFFS BY MOUTH EVERY 6 HOURS AS NEEDED 18 each 2   cetirizine (ZYRTEC) 10 MG tablet TAKE 1 TABLET BY MOUTH ONCE A DAY 90 tablet 0   ciprofloxacin (CIPRO) 500 MG tablet Take 1 tablet (500 mg total) by mouth 2 (two) times daily for 3 days. 6 tablet 0   cyclobenzaprine (FLEXERIL) 5 MG tablet 1 tab po prn back pain prior to sleep. 7 tablet 0   fluticasone (FLONASE) 50 MCG/ACT nasal spray Place 2 sprays into both nostrils daily. 16 g 1   metFORMIN (GLUCOPHAGE) 500 MG tablet Take 1 tablet (500 mg total) by mouth 2 (two) times daily with a meal. 180 tablet 3   naproxen sodium (ALEVE) 220 MG tablet Take 440 mg 2 (two) times daily as needed by mouth.     Norgestimate-Ethinyl Estradiol Triphasic (ORTHO TRI-CYCLEN, 28,) 0.18/0.215/0.25 MG-35 MCG tablet Take 1 tablet by mouth daily. 28 tablet 11   ondansetron (ZOFRAN-ODT) 4 MG disintegrating tablet Take 1 tablet (4 mg total) by mouth every 8 (eight) hours as needed for nausea or vomiting. 20 tablet 0   No current facility-administered medications for this visit.    Allergies: Patient has no known allergies.  Past Medical History:  Diagnosis Date   Abnormal uterine bleeding    Abnormal uterine bleeding    Anemia    Fibroids    PCOS (polycystic ovarian syndrome)     No past surgical history on file.  Family History  Problem Relation Age of Onset   Hypertension Mother    Diabetes Mother    Hypertension Father    Diabetes Father     Social History   Tobacco Use   Smoking status: Never   Smokeless tobacco: Never  Substance Use Topics   Alcohol use: No     Subjective:  Started with "stomach bug" last week; did e-visit last Friday and treated with Zofran/ felt better over the weekend; notes that she felt better until yesterday when the symptoms re-flared; vomited 1 x yesterday; diarrhea is the main complaint at this point; denies any recent antibiotic usage/ no one else at home is sick; denies any abdominal pain;   LMP 2 weeks ago    Objective:  Vitals:   12/25/21 1434  BP: 116/82  Pulse: 87  Resp: 16  Temp: 98.1 F (36.7 C)  TempSrc: Oral  SpO2: 98%  Weight: 234 lb 6 oz (106.3 kg)  Height: 5' (1.524 m)    General: Well developed, well nourished, in no acute distress  Skin : Warm and dry.  Head: Normocephalic and atraumatic  Lungs: Respirations unlabored; clear to auscultation bilaterally without wheeze, rales, rhonchi  CVS exam: normal rate and regular rhythm.  Abdomen: Soft; nontender; nondistended; normoactive bowel sounds; no masses or hepatosplenomegaly  Neurologic: Alert and oriented; speech intact; face symmetrical; moves all extremities well; CNII-XII intact without focal deficit   Assessment:  1. Diarrhea, unspecified type     Plan:  Check CBC, CMP today; BRAT diet discussed; work note written as requested for Wednesday- Friday;  Rx for Cipro is written for patient- she will hold for   now and see if her symptoms continue to improve; may also need to consider stool culture if symptoms persist.   No follow-ups on file.  Orders Placed This Encounter  Procedures   CBC with Differential/Platelet   Comp Met (CMET)    Requested Prescriptions   Signed Prescriptions Disp Refills   ciprofloxacin (CIPRO) 500 MG tablet 6 tablet 0    Sig: Take 1 tablet (500 mg total) by mouth 2 (two) times daily for 3 days.

## 2021-12-26 ENCOUNTER — Ambulatory Visit: Payer: BLUE CROSS/BLUE SHIELD | Admitting: Medical

## 2021-12-26 LAB — CBC WITH DIFFERENTIAL/PLATELET
Basophils Absolute: 0.1 10*3/uL (ref 0.0–0.1)
Basophils Relative: 0.9 % (ref 0.0–3.0)
Eosinophils Absolute: 0.2 10*3/uL (ref 0.0–0.7)
Eosinophils Relative: 3 % (ref 0.0–5.0)
HCT: 37.6 % (ref 36.0–46.0)
Hemoglobin: 12.1 g/dL (ref 12.0–15.0)
Lymphocytes Relative: 20.5 % (ref 12.0–46.0)
Lymphs Abs: 1.7 10*3/uL (ref 0.7–4.0)
MCHC: 32.2 g/dL (ref 30.0–36.0)
MCV: 89.6 fl (ref 78.0–100.0)
Monocytes Absolute: 0.7 10*3/uL (ref 0.1–1.0)
Monocytes Relative: 8.4 % (ref 3.0–12.0)
Neutro Abs: 5.6 10*3/uL (ref 1.4–7.7)
Neutrophils Relative %: 67.2 % (ref 43.0–77.0)
Platelets: 341 10*3/uL (ref 150.0–400.0)
RBC: 4.2 Mil/uL (ref 3.87–5.11)
RDW: 12.9 % (ref 11.5–15.5)
WBC: 8.4 10*3/uL (ref 4.0–10.5)

## 2021-12-26 LAB — COMPREHENSIVE METABOLIC PANEL
ALT: 31 U/L (ref 0–35)
AST: 26 U/L (ref 0–37)
Albumin: 4.1 g/dL (ref 3.5–5.2)
Alkaline Phosphatase: 74 U/L (ref 39–117)
BUN: 8 mg/dL (ref 6–23)
CO2: 24 mEq/L (ref 19–32)
Calcium: 9.2 mg/dL (ref 8.4–10.5)
Chloride: 104 mEq/L (ref 96–112)
Creatinine, Ser: 0.57 mg/dL (ref 0.40–1.20)
GFR: 120.76 mL/min (ref >60.00)
Glucose, Bld: 108 mg/dL — ABNORMAL HIGH (ref 70–99)
Potassium: 4.3 mEq/L (ref 3.5–5.1)
Sodium: 136 mEq/L (ref 135–145)
Total Bilirubin: 0.2 mg/dL (ref 0.2–1.2)
Total Protein: 7.2 g/dL (ref 6.0–8.3)

## 2021-12-30 ENCOUNTER — Telehealth (INDEPENDENT_AMBULATORY_CARE_PROVIDER_SITE_OTHER): Payer: Self-pay | Admitting: Family

## 2021-12-30 ENCOUNTER — Other Ambulatory Visit: Payer: Self-pay | Admitting: Family

## 2021-12-30 ENCOUNTER — Encounter: Payer: Self-pay | Admitting: Family

## 2021-12-30 VITALS — Ht 60.0 in | Wt 234.0 lb

## 2021-12-30 DIAGNOSIS — R197 Diarrhea, unspecified: Secondary | ICD-10-CM

## 2021-12-30 NOTE — Progress Notes (Signed)
Lori Gillespie is a 31 y.o. female with the following history as recorded in [employer]:  Patient Active Problem List   Diagnosis Date Noted   Bilateral carpal tunnel syndrome 07/30/2020   Symptomatic anemia    Abnormal uterine bleeding (AUB) 03/05/2017    Current Outpatient Medications  Medication Sig Dispense Refill   albuterol (VENTOLIN HFA) 108 (90 Base) MCG/ACT inhaler TAKE 2 PUFFS BY MOUTH EVERY 6 HOURS AS NEEDED 18 each 2   cetirizine (ZYRTEC) 10 MG tablet TAKE 1 TABLET BY MOUTH ONCE A DAY 90 tablet 0   cyclobenzaprine (FLEXERIL) 5 MG tablet 1 tab po prn back pain prior to sleep. 7 tablet 0   fluticasone (FLONASE) 50 MCG/ACT nasal spray Place 2 sprays into both nostrils daily. 16 g 1   metFORMIN (GLUCOPHAGE) 500 MG tablet Take 1 tablet (500 mg total) by mouth 2 (two) times daily with a meal. 180 tablet 3   naproxen sodium (ALEVE) 220 MG tablet Take 440 mg 2 (two) times daily as needed by mouth.     Norgestimate-Ethinyl Estradiol Triphasic (ORTHO TRI-CYCLEN, 28,) 0.18/0.215/0.25 MG-35 MCG tablet Take 1 tablet by mouth daily. 28 tablet 11   ondansetron (ZOFRAN-ODT) 4 MG disintegrating tablet Take 1 tablet (4 mg total) by mouth every 8 (eight) hours as needed for nausea or vomiting. 20 tablet 0   No current facility-administered medications for this visit.    Allergies: Patient has no known allergies.  Past Medical History:  Diagnosis Date   Abnormal uterine bleeding    Abnormal uterine bleeding    Anemia    Fibroids    PCOS (polycystic ovarian syndrome)     No past surgical history on file.  Family History  Problem Relation Age of Onset   Hypertension Mother    Diabetes Mother    Hypertension Father    Diabetes Father     Social History   Tobacco Use   Smoking status: Never   Smokeless tobacco: Never  Substance Use Topics   Alcohol use: No    Subjective:   I connected with Lori Gillespie on 12/30/21 at  2:40 PM EDT by a video enabled telemedicine application  and verified that I am speaking with the correct person using two identifiers.   I discussed the limitations of evaluation and management by telemedicine and the availability of in person appointments. The patient expressed understanding and agreed to proceed. Provider in office/ patient is at home; provider and patient are only 2 people on video call.   Seen last Thursday with concerns for diarrhea; labs and patient's physical exam reassuring last week; was given 3 days of Cipro- did complete but did not feel beneficial; notes she had 4-5 more episodes of diarrhea over the weekend; no blood in the stool; unable to go to work today; would like to discuss stool culture- does not feel she can get to the office until Thursday or Friday to pick up stool kit; asking for work note today;    Objective:  Vitals:   12/30/21 1419  Weight: 234 lb (106.1 kg)  Height: 5' (1.524 m)    General: Well developed, well nourished, in no acute distress  Skin : Warm and dry.  Head: Normocephalic and atraumatic  Lungs: Respirations unlabored;  Neurologic: Alert and oriented; speech intact; face symmetrical; moves all extremities well; CNII-XII intact without focal deficit   Assessment:  1. Diarrhea, unspecified type     Plan:  Work note given for today as requested; order updated for  stool culture- asked patient to try and come get kit as soon as she can but it may be Thursday or Friday before she can get to our office; [place], [place] discussed today;   No follow-ups on file.  Orders Placed This Encounter  Procedures   GI Profile, Stool, PCR    Standing Status:   Future    Standing Expiration Date:   12/31/2022    Requested Prescriptions    No prescriptions requested or ordered in this encounter

## 2021-12-31 ENCOUNTER — Encounter: Payer: Self-pay | Admitting: Family

## 2022-01-02 ENCOUNTER — Telehealth: Payer: Self-pay | Admitting: Family

## 2022-01-02 ENCOUNTER — Other Ambulatory Visit: Payer: Self-pay | Admitting: Family

## 2022-01-02 DIAGNOSIS — R197 Diarrhea, unspecified: Secondary | ICD-10-CM

## 2022-01-26 MED ORDER — NORGESTIM-ETH ESTRAD TRIPHASIC 0.18/0.215/0.25 MG-35 MCG PO TABS: 1.0000 | ORAL_TABLET | Freq: Every day | ORAL | 11 refills | Status: DC

## 2022-01-26 NOTE — Addendum Note (Signed)
Addended by: Jeronimo Greaves on: 01/26/2022 11:00 AM   Modules accepted: Orders

## 2022-01-30 MED ORDER — ALBUTEROL SULFATE HFA 108 (90 BASE) MCG/ACT IN AERS: INHALATION_SPRAY | RESPIRATORY_TRACT | 2 refills | Status: DC

## 2022-01-30 NOTE — Addendum Note (Signed)
Addended by: Jeronimo Greaves on: 01/30/2022 08:57 AM   Modules accepted: Orders

## 2022-02-01 ENCOUNTER — Encounter: Payer: Self-pay | Admitting: Family Medicine

## 2022-02-02 ENCOUNTER — Ambulatory Visit: Payer: Self-pay | Admitting: Family Medicine

## 2022-02-03 ENCOUNTER — Ambulatory Visit: Payer: Self-pay

## 2022-02-04 ENCOUNTER — Ambulatory Visit: Payer: Self-pay

## 2022-02-27 ENCOUNTER — Telehealth (INDEPENDENT_AMBULATORY_CARE_PROVIDER_SITE_OTHER): Payer: Self-pay | Admitting: Medical

## 2022-02-27 DIAGNOSIS — R197 Diarrhea, unspecified: Secondary | ICD-10-CM

## 2022-02-27 DIAGNOSIS — R1110 Vomiting, unspecified: Secondary | ICD-10-CM

## 2022-02-27 MED ORDER — ONDANSETRON HCL 4 MG PO TABS: 4.0000 mg | ORAL_TABLET | Freq: Three times a day (TID) | ORAL | 0 refills | Status: DC | PRN

## 2022-02-27 NOTE — Progress Notes (Signed)
   Subjective:    Patient ID: Lori Gillespie, female    DOB: Apr 18, 1991, 31 y.o.   MRN: [id number]  HPI  Virtual Visit via Video Note  I connected with Lori Gillespie on 02/27/22 at  3:40 PM EDT by a video enabled telemedicine application and verified that I am speaking with the correct person using two identifiers.  Location: Patient: home Provider: office   I discussed the limitations of evaluation and management by telemedicine and the availability of in person appointments. The patient expressed understanding and agreed to proceed.  History of Present Illness:  Pt in reporting stomach pain,vomiting, low grade fever  and diarrhea. Started on Tuesday oct 31,2023. Pt states she ate Buffet/chinese food in [address]. No one else got sick. Pt states ate sushi. She states in past when eats sushi will often get sick.  Pt states vomits about 4-6 times a day. But last time vomited was this morning.   Pt states having about 6-8 times a day.   No antibiotics recently.  LMP- pt did not get menstrual. Pt is on ocp.   Pt needs work note.    Observations/Objective: Poor connections Assessment and Plan:  Patient Instructions  Diarrhea since eating [place] food on October 31.  Patient describes having a food poisoning type illness every time she eats sushi in the past.  Vomiting about 4-6 times a day.  But only once since this morning.  Having about 6-8 episodes of diarrhea daily.  No antibiotics preceding onset of illness.  Seeing patient for 4:30 in the afternoon Friday evening.  She chose to video visit or in person visit.  Patient was in [place] and was unable to pick up stool panel study kit.  Ideally I would have wanted her to turn that in today and then get results early next week.  Went ahead and placed future stool culture, C. difficile and ova and parasite test.  Asked patient to hydrate well with sugar-free Gatorade or sugar-free propel this weekend.  Eat bland diet.   Avoid any greasy, fatty foods or fruit drinks.  I am prescribing Zofran for nausea and vomiting.  Use Imodium for diarrhea.  Explained reasoning behind not prescribing antibiotics today.  Sent work excuse note.  Advised patient that starting Sunday evening at 6 PM not using any Imodium.  See if diarrhea flares again.  If the diarrhea by Monday morning persisting then extend work note and turn in stool test.  Follow-up date to be determined depending on how patient does over the weekend.   Mackie Pai, PA-C  Follow Up Instructions:    I discussed the assessment and treatment plan with the patient. The patient was provided an opportunity to ask questions and all were answered. The patient agreed with the plan and demonstrated an understanding of the instructions.   The patient was advised to call back or seek an in-person evaluation if the symptoms worsen or if the condition fails to improve as anticipated.     Mackie Pai, PA-C   Review of Systems     Objective:   Physical Exam        Assessment & Plan:

## 2022-02-27 NOTE — Patient Instructions (Signed)
Diarrhea since eating [place] food on October 31.  Patient describes having a food poisoning type illness every time she eats sushi in the past.  Vomiting about 4-6 times a day.  But only once since this morning.  Having about 6-8 episodes of diarrhea daily.  No antibiotics preceding onset of illness.  Seeing patient for 4:30 in the afternoon Friday evening.  She chose to video visit or in person visit.  Patient was in [place] and was unable to pick up stool panel study kit.  Ideally I would have wanted her to turn that in today and then get results early next week.  Went ahead and placed future stool culture, C. difficile and ova and parasite test.  Asked patient to hydrate well with sugar-free Gatorade or sugar-free propel this weekend.  Eat bland diet.  Avoid any greasy, fatty foods or fruit drinks.  I am prescribing Zofran for nausea and vomiting.  Use Imodium for diarrhea.  Explained reasoning behind not prescribing antibiotics today.  Sent work excuse note.  Advised patient that starting Sunday evening at 6 PM not using any Imodium.  See if diarrhea flares again.  If the diarrhea by Monday morning persisting then extend work note and turn in stool test.  Follow-up date to be determined depending on how patient does over the weekend.

## 2022-03-03 ENCOUNTER — Encounter: Payer: Self-pay | Admitting: Medical

## 2022-03-06 MED ORDER — NORGESTIM-ETH ESTRAD TRIPHASIC 0.18/0.215/0.25 MG-35 MCG PO TABS: 1.0000 | ORAL_TABLET | Freq: Every day | ORAL | 11 refills | Status: DC

## 2022-03-06 NOTE — Addendum Note (Signed)
Addended by: Jeronimo Greaves on: 03/06/2022 01:26 PM   Modules accepted: Orders

## 2022-03-07 ENCOUNTER — Encounter (HOSPITAL_COMMUNITY): Payer: Self-pay

## 2022-03-07 ENCOUNTER — Ambulatory Visit (HOSPITAL_COMMUNITY)
Admission: EM | Admit: 2022-03-07 | Discharge: 2022-03-07 | Disposition: A | Discharge status: Home / Self Care | Payer: Self-pay | Attending: Physician Assistant | Admitting: Physician Assistant

## 2022-03-07 DIAGNOSIS — A084 Viral intestinal infection, unspecified: Secondary | ICD-10-CM

## 2022-03-07 DIAGNOSIS — K529 Noninfective gastroenteritis and colitis, unspecified: Secondary | ICD-10-CM

## 2022-03-07 MED ORDER — ONDANSETRON 4 MG PO TBDP: 4.0000 mg | ORAL_TABLET | Freq: Three times a day (TID) | ORAL | 0 refills | Status: DC | PRN

## 2022-03-07 NOTE — ED Provider Notes (Signed)
[facility]   MRN: [id number] DOB: Aug 12, 1990  Subjective:   Lori Gillespie is a 31 y.o. female presenting for GI symptoms for the last 24 hours.  Patient states that she has had some crampy abdominal pain, nausea, vomiting, diarrhea.  Symptoms are starting to slow down while she is here at the office.  She has had 3 loose stools today so far.  She reports that she had similar symptoms last week that lasted for a week and she saw her primary care doctor.  They did suggest a stool culture, but her symptoms completely resolved and she had normal stools for a few days, before this started up again.  She is feeling tired, but no major headache or dizziness.  No chest pain or shortness of breath.  No other sick contacts.  She is requesting a work note and nausea medicine.  No current facility-administered medications for this encounter.  Current Outpatient Medications:    albuterol (VENTOLIN HFA) 108 (90 Base) MCG/ACT inhaler, TAKE 2 PUFFS BY MOUTH EVERY 6 HOURS AS NEEDED, Disp: 18 each, Rfl: 2   cetirizine (ZYRTEC) 10 MG tablet, TAKE 1 TABLET BY MOUTH ONCE A DAY, Disp: 90 tablet, Rfl: 0   cyclobenzaprine (FLEXERIL) 5 MG tablet, 1 tab po prn back pain prior to sleep., Disp: 7 tablet, Rfl: 0   fluticasone (FLONASE) 50 MCG/ACT nasal spray, Place 2 sprays into both nostrils daily., Disp: 16 g, Rfl: 1   metFORMIN (GLUCOPHAGE) 500 MG tablet, Take 1 tablet (500 mg total) by mouth 2 (two) times daily with a meal., Disp: 180 tablet, Rfl: 3   naproxen sodium (ALEVE) 220 MG tablet, Take 440 mg 2 (two) times daily as needed by mouth., Disp: , Rfl:    Norgestimate-Ethinyl Estradiol Triphasic (ORTHO TRI-CYCLEN, 28,) 0.18/0.215/0.25 MG-35 MCG tablet, Take 1 tablet by mouth daily., Disp: 28 tablet, Rfl: 11   ondansetron (ZOFRAN) 4 MG tablet, Take 1 tablet (4 mg total) by mouth every 8 (eight) hours as needed for nausea or vomiting., Disp: 20 tablet, Rfl: 0   ondansetron (ZOFRAN-ODT) 4 MG  disintegrating tablet, Take 1 tablet (4 mg total) by mouth every 8 (eight) hours as needed for nausea or vomiting., Disp: 20 tablet, Rfl: 0   No Known Allergies  Past Medical History:  Diagnosis Date   Abnormal uterine bleeding    Abnormal uterine bleeding    Anemia    Fibroids    PCOS (polycystic ovarian syndrome)      No past surgical history on file.  Family History  Problem Relation Age of Onset   Hypertension Mother    Diabetes Mother    Hypertension Father    Diabetes Father     Social History   Tobacco Use   Smoking status: Never   Smokeless tobacco: Never  Vaping Use   Vaping Use: Never used  Substance Use Topics   Alcohol use: No   Drug use: No    ROS REFER TO HPI FOR PERTINENT POSITIVES AND NEGATIVES   Objective:   Vitals: BP (!) 140/95 (BP Location: Right Wrist)   Pulse 89   Temp 98.5 F (36.9 C) (Oral)   Resp 16   LMP 01/21/2022   SpO2 97%   Physical Exam Vitals and nursing note reviewed.  Constitutional:      Appearance: Normal appearance. She is normal weight. She is not toxic-appearing.  HENT:     Head: Normocephalic and atraumatic.     Right Ear: Ear canal  and external ear normal.     Left Ear: Ear canal and external ear normal.     Nose: Nose normal.     Mouth/Throat:     Mouth: Mucous membranes are moist.  Eyes:     Extraocular Movements: Extraocular movements intact.     Conjunctiva/sclera: Conjunctivae normal.     Pupils: Pupils are equal, round, and reactive to light.  Cardiovascular:     Rate and Rhythm: Normal rate and regular rhythm.     Pulses: Normal pulses.     Heart sounds: Normal heart sounds.  Pulmonary:     Effort: Pulmonary effort is normal.     Breath sounds: Normal breath sounds.  Abdominal:     General: Abdomen is flat. Bowel sounds are normal.     Palpations: Abdomen is soft.     Tenderness: There is no abdominal tenderness. There is no right CVA tenderness or left CVA tenderness.  Musculoskeletal:         General: Normal range of motion.     Cervical back: Normal range of motion and neck supple.  Skin:    General: Skin is warm and dry.  Neurological:     General: No focal deficit present.     Mental Status: She is alert and oriented to person, place, and time.  Psychiatric:        Mood and Affect: Mood normal.        Behavior: Behavior normal.        Thought Content: Thought content normal.        Judgment: Judgment normal.     No results found for this or any previous visit (from the past 24 hour(s)).  Assessment and Plan :   PDMP not reviewed this encounter.  1. Gastroenteritis   2. Viral gastroenteritis    Patient overall well-appearing, vital signs stable on, she is afebrile.  Reassured likely viral GE that is starting to improve.  Gave Zofran 4 mg to take as directed for nausea.  Slowly push fluids.  Brat diet.  Recheck as needed.  She is going to reach out to her PCP to perform stool culture next week if her diarrhea continues.    Amorie Rentz, Randa Evens, PA-C 03/07/22 1600

## 2022-03-07 NOTE — Discharge Instructions (Signed)
Take the Zofran every 6-8 hours as needed for nausea or vomiting.  Caution drowsiness as possible side effect.  Try to slowly increase fluids. Bananas, rice, applesauce, toast, yogurt in the diet for the next 48 hours.  Follow-up with your PCP if worse or not improving.  Emergency department if any blood in the vomit or stool, or severely worsening symptoms.

## 2022-03-07 NOTE — ED Triage Notes (Signed)
Pt is here for abdominal pain, diarrhea, headache , vomiting and nausea x1day

## 2022-03-09 ENCOUNTER — Encounter: Payer: Self-pay | Admitting: Medical

## 2022-03-10 ENCOUNTER — Telehealth: Payer: Self-pay | Admitting: Medical

## 2022-03-23 MED ORDER — NORGESTIM-ETH ESTRAD TRIPHASIC 0.18/0.215/0.25 MG-35 MCG PO TABS: 1.0000 | ORAL_TABLET | Freq: Every day | ORAL | 11 refills | Status: DC

## 2022-03-23 NOTE — Addendum Note (Signed)
Addended by: Jeronimo Greaves on: 03/23/2022 03:17 PM   Modules accepted: Orders

## 2022-04-08 MED ORDER — METFORMIN HCL 500 MG PO TABS: 500.0000 mg | ORAL_TABLET | Freq: Two times a day (BID) | ORAL | 0 refills | Status: DC

## 2022-04-08 NOTE — Addendum Note (Signed)
Addended byDamita Dunnings D on: 04/08/2022 08:08 AM   Modules accepted: Orders

## 2022-04-13 ENCOUNTER — Ambulatory Visit: Payer: Self-pay | Admitting: Family Medicine

## 2022-04-15 ENCOUNTER — Ambulatory Visit (INDEPENDENT_AMBULATORY_CARE_PROVIDER_SITE_OTHER): Payer: PRIVATE HEALTH INSURANCE | Admitting: Family Medicine

## 2022-04-15 ENCOUNTER — Encounter: Payer: Self-pay | Admitting: Family Medicine

## 2022-04-15 ENCOUNTER — Ambulatory Visit: Payer: Self-pay

## 2022-04-15 VITALS — BP 118/80 | Ht 60.0 in | Wt 234.0 lb

## 2022-04-15 DIAGNOSIS — G5602 Carpal tunnel syndrome, left upper limb: Secondary | ICD-10-CM | POA: Insufficient documentation

## 2022-04-15 DIAGNOSIS — G5601 Carpal tunnel syndrome, right upper limb: Secondary | ICD-10-CM

## 2022-04-15 MED ORDER — BETAMETHASONE SOD PHOS & ACET 6 (3-3) MG/ML IJ SUSP
6.0000 mg | Freq: Once | INTRAMUSCULAR | Status: AC
  Administered 2022-04-15: 6 mg via INTRA_ARTICULAR

## 2022-04-15 NOTE — Assessment & Plan Note (Signed)
Not having significant pain on the side at this time.  Has had pain here in the past. -Counseled on home exercise therapy and supportive care. -Referral for EMG.

## 2022-04-15 NOTE — Progress Notes (Signed)
  Lori Gillespie - 31 y.o. female MRN [id number]  Date of birth: [birth year]  SUBJECTIVE:  Including CC & ROS.  No chief complaint on file.   Lori Gillespie is a 31 y.o. female that is presenting with acute on chronic right hand pain.  She has done well with her previous injection in the carpal tunnel.   Review of Systems See HPI   HISTORY: Past Medical, Surgical, Social, and Family History Reviewed & Updated per EMR.   Pertinent Historical Findings include:  Past Medical History:  Diagnosis Date   Abnormal uterine bleeding    Abnormal uterine bleeding    Anemia    Fibroids    PCOS (polycystic ovarian syndrome)     History reviewed. No pertinent surgical history.   PHYSICAL EXAM:  VS: BP 118/80 (BP Location: Left Arm, Patient Position: Sitting)   Ht 5' (1.524 m)   Wt 234 lb (106.1 kg)   BMI 45.70 kg/m  Physical Exam Gen: NAD, alert, cooperative with exam, well-appearing MSK:  Neurovascularly intact    Limited ultrasound: Right wrist pain:  Median nerve measures 0.18 cm within the carpal tunnel  Summary: Findings consistent with carpal tunnel syndrome  Ultrasound and interpretation by Clearance Coots, MD  Aspiration/Injection Procedure Note Lori Gillespie 1991/01/28  Procedure: Injection Indications: Right wrist pain  Procedure Details Consent: Risks of procedure as well as the alternatives and risks of each were explained to the (patient/caregiver).  Consent for procedure obtained. Time Out: Verified patient identification, verified procedure, site/side was marked, verified correct patient position, special equipment/implants available, medications/allergies/relevent history reviewed, required imaging and test results available.  Performed.  The area was cleaned with iodine and alcohol swabs.    The right median nerve in the carpal tunnel was injected with 2 cc of normal saline, 2 cc of D5W, 1 cc of 6 mg betamethasone and 2 cc of 0.25% bupivacaine.   Ultrasound was used. Images were obtained in long views showing the injection.     A sterile dressing was applied.  Patient did tolerate procedure well.     ASSESSMENT & PLAN:   Carpal tunnel syndrome on right Acute on chronic in nature.  Exacerbation of her previous carpal tunnel.  The median nerve is enlarged within the carpal tunnel. -Counseled on home exercise therapy and supportive care. -Injection today. -Referral for EMG.  Carpal tunnel syndrome, left Not having significant pain on the side at this time.  Has had pain here in the past. -Counseled on home exercise therapy and supportive care. -Referral for EMG.

## 2022-04-15 NOTE — Patient Instructions (Signed)
Good to see you We'll send a referral to for a EMG/nerve study  Please send me a message in MyChart with any questions or updates.  We'll setup a virtual visit once we get the results back.   --Dr. Raeford Razor

## 2022-04-15 NOTE — Assessment & Plan Note (Addendum)
Acute on chronic in nature.  Exacerbation of her previous carpal tunnel.  The median nerve is enlarged within the carpal tunnel. -Counseled on home exercise therapy and supportive care. -Injection today. -Referral for EMG.

## 2022-04-17 NOTE — Addendum Note (Signed)
Addended by: Cresenciano Lick on: 04/17/2022 08:19 AM   Modules accepted: Orders

## 2022-04-23 ENCOUNTER — Telehealth (INDEPENDENT_AMBULATORY_CARE_PROVIDER_SITE_OTHER): Payer: PRIVATE HEALTH INSURANCE | Admitting: Medical

## 2022-04-23 ENCOUNTER — Encounter: Payer: Self-pay | Admitting: Medical

## 2022-04-23 DIAGNOSIS — J0100 Acute maxillary sinusitis, unspecified: Secondary | ICD-10-CM

## 2022-04-23 DIAGNOSIS — R0981 Nasal congestion: Secondary | ICD-10-CM | POA: Diagnosis not present

## 2022-04-23 DIAGNOSIS — R059 Cough, unspecified: Secondary | ICD-10-CM

## 2022-04-23 DIAGNOSIS — R051 Acute cough: Secondary | ICD-10-CM | POA: Diagnosis not present

## 2022-04-23 LAB — POCT INFLUENZA A/B
Influenza A, POC: NEGATIVE
Influenza B, POC: NEGATIVE

## 2022-04-23 LAB — POC COVID19 BINAXNOW: SARS Coronavirus 2 Ag: NEGATIVE — NL

## 2022-04-23 MED ORDER — AZITHROMYCIN 250 MG PO TABS: ORAL_TABLET | ORAL | 0 refills | Status: AC

## 2022-04-23 MED ORDER — FLUTICASONE PROPIONATE 50 MCG/ACT NA SUSP: 2.0000 | Freq: Every day | NASAL | 1 refills | Status: DC

## 2022-04-23 MED ORDER — BENZONATATE 100 MG PO CAPS: 100.0000 mg | ORAL_CAPSULE | Freq: Three times a day (TID) | ORAL | 0 refills | Status: DC | PRN

## 2022-04-23 NOTE — Progress Notes (Signed)
   Subjective:    Patient ID: Lori Gillespie, female    DOB: [birth year], 31 y.o.   MRN: [id number]  HPI Virtual Visit via Video Note  I connected with Lori Gillespie on 04/23/22 at  2:00 PM EST by a video enabled telemedicine application and verified that I am speaking with the correct person using two identifiers.  Location: Patient: home Provider: office   I discussed the limitations of evaluation and management by telemedicine and the availability of in person appointments. The patient expressed understanding and agreed to proceed.  History of Present Illness: Pt states on christmas afternoon she got scratchy and itch throat. Pt state she has had fatigue ,body aches and ha. Pt never tested for covid. Pt has appointment to get covid test tomorrow.   Pt states early on had fever but no longer feels feverish. Last time had sujective fever last night.   States has sinus pain and st. Some pain on swallowing.  Lmp- 04-07-2022.  Pt has been taking otc cough syrup.   Observations/Objective: General-no acute distress, pleasant, oriented. Lungs- on inspection lungs appear unlabored. Neck- no tracheal deviation or jvd on inspection. Neuro- gross motor function appears intact.  Heent- sounds nasal congested. Ethmoid sinus pressure to palpation  Assessment and Plan: Patient Instructions  Presently on video visit describing viral syndrome initially but now presently  sinus pressure and st. Possible secondary sinus infection and pt requesting antibiotic. Challenge to say definitively on video.  Will rx azithromycin antibiotic, flonase nasal spray for cough and benzonatate for cough.  On discussion decided she would come in for rapid flu and covid test.  Follow up date 7-10 days or sooner if needed.    Mackie Pai, PA-C    Follow Up Instructions:    I discussed the assessment and treatment plan with the patient. The patient was provided an opportunity to ask questions and  all were answered. The patient agreed with the plan and demonstrated an understanding of the instructions.   The patient was advised to call back or seek an in-person evaluation if the symptoms worsen or if the condition fails to improve as anticipated.     Mackie Pai, PA-C    Review of Systems  HENT:  Positive for congestion, sinus pressure, sinus pain and sore throat.   Respiratory:  Positive for cough. Negative for shortness of breath and wheezing.   Cardiovascular:  Negative for chest pain and palpitations.  Gastrointestinal:  Negative for abdominal pain.  Skin:  Negative for rash.  Neurological:  Negative for dizziness, seizures and headaches.  Hematological:  Negative for adenopathy. Does not bruise/bleed easily.       Objective:   Physical Exam        Assessment & Plan:

## 2022-04-23 NOTE — Addendum Note (Signed)
Addended by: Sena Hitch on: 04/23/2022 03:47 PM   Modules accepted: Orders

## 2022-04-23 NOTE — Patient Instructions (Addendum)
Presently on video visit describing viral syndrome initially but now presently  sinus pressure and st. Possible secondary sinus infection and pt requesting antibiotic. Challenge to say definitively on video.  Will rx azithromycin antibiotic, flonase nasal spray for cough and benzonatate for cough.  On discussion decided she would come in for rapid flu and covid test.  Follow up date 7-10 days or sooner if needed.

## 2022-04-24 ENCOUNTER — Encounter: Payer: Self-pay | Admitting: Medical

## 2022-05-01 ENCOUNTER — Encounter: Payer: Self-pay | Admitting: Medical

## 2022-05-01 MED ORDER — BENZONATATE 100 MG PO CAPS: 100.0000 mg | ORAL_CAPSULE | Freq: Three times a day (TID) | ORAL | 0 refills | Status: DC | PRN

## 2022-05-01 NOTE — Addendum Note (Signed)
Addended by: Anabel Halon on: 05/01/2022 02:14 PM   Modules accepted: Orders

## 2022-06-09 ENCOUNTER — Encounter: Payer: Self-pay | Admitting: Medical

## 2022-06-10 ENCOUNTER — Encounter: Payer: Self-pay | Admitting: Medical

## 2022-06-10 MED ORDER — MEGESTROL ACETATE 40 MG PO TABS: 40.0000 mg | ORAL_TABLET | Freq: Two times a day (BID) | ORAL | 0 refills | Status: DC

## 2022-06-10 NOTE — Addendum Note (Signed)
Addended by: Anabel Halon on: 06/10/2022 05:37 PM   Modules accepted: Orders

## 2022-06-16 ENCOUNTER — Encounter: Payer: Self-pay | Admitting: Medical

## 2022-06-17 ENCOUNTER — Ambulatory Visit: Payer: PRIVATE HEALTH INSURANCE | Admitting: Medical

## 2022-06-17 ENCOUNTER — Telehealth (INDEPENDENT_AMBULATORY_CARE_PROVIDER_SITE_OTHER): Payer: PRIVATE HEALTH INSURANCE | Admitting: Medical

## 2022-06-17 DIAGNOSIS — Z8616 Personal history of COVID-19: Secondary | ICD-10-CM

## 2022-06-17 DIAGNOSIS — J0100 Acute maxillary sinusitis, unspecified: Secondary | ICD-10-CM

## 2022-06-17 MED ORDER — FLUTICASONE PROPIONATE 50 MCG/ACT NA SUSP: 2.0000 | Freq: Every day | NASAL | 1 refills | Status: DC

## 2022-06-17 MED ORDER — BENZONATATE 100 MG PO CAPS: 100.0000 mg | ORAL_CAPSULE | Freq: Three times a day (TID) | ORAL | 0 refills | Status: DC | PRN

## 2022-06-17 MED ORDER — AZITHROMYCIN 250 MG PO TABS: ORAL_TABLET | ORAL | 0 refills | Status: AC

## 2022-06-17 NOTE — Progress Notes (Unsigned)
   Subjective:    Patient ID: Lori Gillespie, female    DOB: 10-Aug-1990, 32 y.o.   MRN: [id number]  HPI Virtual Visit via Video Note  I connected with [name] on 06/17/22 at  2:20 PM EST by a video enabled telemedicine application and verified that I am speaking with the correct person using two identifiers.  Location: Patient: home Provider: office   I discussed the limitations of evaluation and management by telemedicine and the availability of in person appointments. The patient expressed understanding and agreed to proceed.  History of Present Illness:  Symptoms onset 10 days ago. She had st, cough, bodyaches,  runny nose and sinus pressure. About 7 days ago she tested + for covid.   Pt states overall she does feel better but cough and sinus pressure persists.  Pt never was treated.   She missed work from days feb 12 thru the 21st.  Pt has retested for covid and came back negative.   Observations/Objective: General-no acute distress, pleasant, oriented. Lungs- on inspection lungs appear unlabored. Neck- no tracheal deviation or jvd on inspection. Neuro- gross motor function appears intact.   Assessment and Plan: Patient Instructions  Recent covid with residual signs/symptoms of sinus pressure. Likely probable secondary sinus infection.  Rx azithromycin antibiotic, benzonatate for cough and flonase for nasal congestion.  Follow up in 7-10 days or sooner if needed.   Follow Up Instructions:    I discussed the assessment and treatment plan with the patient. The patient was provided an opportunity to ask questions and all were answered. The patient agreed with the plan and demonstrated an understanding of the instructions.   The patient was advised to call back or seek an in-person evaluation if the symptoms worsen or if the condition fails to improve as anticipated.     Mackie Pai, PA-C   Review of Systems     Objective:   Physical  Exam        Assessment & Plan:

## 2022-06-18 NOTE — Patient Instructions (Addendum)
Recent covid with residual signs/symptoms of sinus pressure. Likely probable secondary sinus infection.  Rx azithromycin antibiotic, benzonatate for cough and flonase for nasal congestion.  Follow up in 7-10 days or sooner if needed.

## 2022-07-06 ENCOUNTER — Encounter: Payer: Self-pay | Admitting: Family Medicine

## 2022-07-06 ENCOUNTER — Ambulatory Visit (INDEPENDENT_AMBULATORY_CARE_PROVIDER_SITE_OTHER): Payer: PRIVATE HEALTH INSURANCE | Admitting: Family Medicine

## 2022-07-06 ENCOUNTER — Encounter: Payer: Self-pay | Admitting: *Deleted

## 2022-07-06 VITALS — BP 134/94 | Ht 60.0 in | Wt 238.0 lb

## 2022-07-06 DIAGNOSIS — G5602 Carpal tunnel syndrome, left upper limb: Secondary | ICD-10-CM

## 2022-07-06 DIAGNOSIS — G5601 Carpal tunnel syndrome, right upper limb: Secondary | ICD-10-CM | POA: Diagnosis not present

## 2022-07-06 MED ORDER — PREDNISONE 5 MG PO TABS: ORAL_TABLET | ORAL | 0 refills | Status: DC

## 2022-07-06 NOTE — Assessment & Plan Note (Signed)
Right is worse is than left. Has received injections in the left as well  - counseled on home exercise therapy and supportive care - pursue EMG

## 2022-07-06 NOTE — Progress Notes (Signed)
  Lori Gillespie - 32 y.o. female MRN [id number]  Date of birth: [birth year]  SUBJECTIVE:  Including CC & ROS.  No chief complaint on file.   Lori Gillespie is a 32 y.o. female that is presenting with acute on chronic bilateral wrist pain.  The right is worse than the left.  She has received several injections in the right wrist.    Review of Systems See HPI   HISTORY: Past Medical, Surgical, Social, and Family History Reviewed & Updated per EMR.   Pertinent Historical Findings include:  Past Medical History:  Diagnosis Date   Abnormal uterine bleeding    Abnormal uterine bleeding    Anemia    Fibroids    PCOS (polycystic ovarian syndrome)     History reviewed. No pertinent surgical history.   PHYSICAL EXAM:  VS: BP (!) 134/94   Ht 5' (1.524 m)   Wt 238 lb (108 kg)   BMI 46.48 kg/m  Physical Exam Gen: NAD, alert, cooperative with exam, well-appearing MSK:  Neurovascularly intact       ASSESSMENT & PLAN:   Carpal tunnel syndrome on right Acutely occurring.  Having severe arm pain as well as pain in the ulnar nerve aspect of the arm.  Has received several injections into the wrist. -Counseled on home exercise therapy and supportive care. -Prednisone. -Referral for EMG and consideration of release.   Carpal tunnel syndrome, left Right is worse is than left. Has received injections in the left as well  - counseled on home exercise therapy and supportive care - pursue EMG

## 2022-07-06 NOTE — Assessment & Plan Note (Signed)
Acutely occurring.  Having severe arm pain as well as pain in the ulnar nerve aspect of the arm.  Has received several injections into the wrist. -Counseled on home exercise therapy and supportive care. -Prednisone. -Referral for EMG and consideration of release.

## 2022-07-06 NOTE — Patient Instructions (Signed)
Good to see you We'll send you for a nerve conduction study   Please send me a message in MyChart with any questions or updates. We'll setup a virtual visit once the nerve study is completed.   --Dr. Raeford Razor

## 2022-07-09 ENCOUNTER — Encounter: Payer: Self-pay | Admitting: Physical Medicine & Rehabilitation

## 2022-07-28 ENCOUNTER — Other Ambulatory Visit: Payer: Self-pay | Admitting: Medical

## 2022-07-28 MED ORDER — MEGESTROL ACETATE 40 MG PO TABS: 40.0000 mg | ORAL_TABLET | Freq: Two times a day (BID) | ORAL | 0 refills | Status: DC

## 2022-08-10 ENCOUNTER — Encounter: Payer: Self-pay | Admitting: *Deleted

## 2022-08-18 ENCOUNTER — Other Ambulatory Visit: Payer: Self-pay | Admitting: Urgent Care

## 2022-08-18 ENCOUNTER — Encounter: Payer: Self-pay | Admitting: Physical Medicine & Rehabilitation

## 2022-08-18 ENCOUNTER — Encounter
Payer: PRIVATE HEALTH INSURANCE | Attending: Physical Medicine & Rehabilitation | Admitting: Physical Medicine & Rehabilitation

## 2022-08-18 VITALS — BP 117/83 | HR 110 | Temp 98.5°F | Ht 60.0 in | Wt 237.0 lb

## 2022-08-18 DIAGNOSIS — R200 Anesthesia of skin: Secondary | ICD-10-CM | POA: Diagnosis not present

## 2022-08-18 DIAGNOSIS — R202 Paresthesia of skin: Secondary | ICD-10-CM | POA: Diagnosis not present

## 2022-08-18 NOTE — Progress Notes (Signed)
32 year old female with insidious onset bilateral hand numbness starting around 4 years ago referred for EMG/NCV of bilateral upper extremities.  She had musculoskeletal ultrasound demonstrating swelling of the median nerve at the wrist.  She has responded positively to carpal tunnel injections under ultrasound guidance per sports medicine. EMG/NCV findings demonstrate slowing of median nerve conductions bilaterally at the wrist affecting the motor greater than sensory components.  Needle EMG showed no abnormalities. Result consistent with moderate CTS slightly worse on the left side.   No ulnar nerve conduction abnormalities were seen.  Patient will follow-up with sports medicine to discuss further treatment options.

## 2022-08-21 ENCOUNTER — Other Ambulatory Visit (HOSPITAL_BASED_OUTPATIENT_CLINIC_OR_DEPARTMENT_OTHER): Payer: Self-pay

## 2022-08-24 ENCOUNTER — Encounter: Payer: Self-pay | Admitting: Family Medicine

## 2022-08-31 ENCOUNTER — Other Ambulatory Visit: Payer: Self-pay | Admitting: Medical

## 2022-08-31 MED ORDER — METFORMIN HCL 500 MG PO TABS: 500.0000 mg | ORAL_TABLET | Freq: Two times a day (BID) | ORAL | 0 refills | Status: DC

## 2022-09-01 ENCOUNTER — Telehealth (INDEPENDENT_AMBULATORY_CARE_PROVIDER_SITE_OTHER): Payer: PRIVATE HEALTH INSURANCE | Admitting: Family Medicine

## 2022-09-01 DIAGNOSIS — G5601 Carpal tunnel syndrome, right upper limb: Secondary | ICD-10-CM

## 2022-09-01 DIAGNOSIS — G5602 Carpal tunnel syndrome, left upper limb: Secondary | ICD-10-CM | POA: Diagnosis not present

## 2022-09-01 NOTE — Assessment & Plan Note (Signed)
Acute on chronic in nature.  EMG is showing is worse on the left than the right.  We have tried several injections thus far. -Counseled on home exercise therapy and supportive care. -Referral to hand surgeon

## 2022-09-01 NOTE — Assessment & Plan Note (Signed)
Acute on chronic in nature.  EMG is showing is worse on the left than the right.  We have tried several injections thus far. -Counseled on home exercise therapy and supportive care. -Referral to hand surgeon 

## 2022-09-01 NOTE — Progress Notes (Signed)
Virtual Visit via Video Note  I connected with Lori Gillespie on 09/01/22 at  1:10 PM EDT by a video enabled telemedicine application and verified that I am speaking with the correct person using two identifiers.  Location: Patient: work Provider: office   I discussed the limitations of evaluation and management by telemedicine and the availability of in person appointments. The patient expressed understanding and agreed to proceed.  History of Present Illness:  Lori Gillespie is a 32 year old female that is following up after her recent EMG studies.  This was demonstrating carpal tunnel syndrome bilaterally that is moderate in nature.  Left being worse than the right.  She has been ongoing for several years and she has tried several injections.    Observations/Objective:   Assessment and Plan:  Bilateral carpal tunnel syndrome: Acute on chronic in nature.  EMG is showing is worse on the left than the right.  We have tried several injections thus far. -Counseled on home exercise therapy and supportive care. -Referral to hand surgeon  Follow Up Instructions:    I discussed the assessment and treatment plan with the patient. The patient was provided an opportunity to ask questions and all were answered. The patient agreed with the plan and demonstrated an understanding of the instructions.   The patient was advised to call back or seek an in-person evaluation if the symptoms worsen or if the condition fails to improve as anticipated.   Clare Gandy, MD

## 2022-09-04 ENCOUNTER — Encounter: Payer: Self-pay | Admitting: Medical

## 2022-09-07 ENCOUNTER — Encounter: Payer: Self-pay | Admitting: Family Medicine

## 2022-09-08 MED ORDER — PREDNISONE 10 MG PO TABS: ORAL_TABLET | ORAL | 0 refills | Status: DC

## 2022-10-08 ENCOUNTER — Ambulatory Visit: Payer: Self-pay

## 2022-10-08 ENCOUNTER — Telehealth: Payer: Self-pay | Admitting: Family Medicine

## 2022-10-08 ENCOUNTER — Encounter: Payer: Self-pay | Admitting: Medical

## 2022-10-08 DIAGNOSIS — N926 Irregular menstruation, unspecified: Secondary | ICD-10-CM

## 2022-10-08 NOTE — Progress Notes (Signed)
Virtual Visit Consent   Lori Gillespie, you are scheduled for a virtual visit with a [facility] provider today. Just as with appointments in the office, your consent must be obtained to participate. Your consent will be active for this visit and any virtual visit you may have with one of our providers in the next 365 days. If you have a MyChart account, a copy of this consent can be sent to you electronically.  As this is a virtual visit, video technology does not allow for your provider to perform a traditional examination. This may limit your provider's ability to fully assess your condition. If your provider identifies any concerns that need to be evaluated in person or the need to arrange testing (such as labs, EKG, etc.), we will make arrangements to do so. Although advances in technology are sophisticated, we cannot ensure that it will always work on either your end or our end. If the connection with a video visit is poor, the visit may have to be switched to a telephone visit. With either a video or telephone visit, we are not always able to ensure that we have a secure connection.  By engaging in this virtual visit, you consent to the provision of healthcare and authorize for your insurance to be billed (if applicable) for the services provided during this visit. Depending on your insurance coverage, you may receive a charge related to this service.  I need to obtain your verbal consent now. Are you willing to proceed with your visit today? Deari Lori Gillespie has provided verbal consent on 10/08/2022 for a virtual visit (video or telephone). Freddy Finner, NP  Date: 10/08/2022 2:02 PM  Virtual Visit via Video Note   I, Freddy Finner, connected with  Filbert Berthold  ([id number], 12/11/1990) on 10/08/22 at  2:00 PM EDT by a video-enabled telemedicine application and verified that I am speaking with the correct person using two identifiers.  Location: Patient: Virtual Visit Location  Patient: Home Provider: Virtual Visit Location Provider: Home Office   I discussed the limitations of evaluation and management by telemedicine and the availability of in person appointments. The patient expressed understanding and agreed to proceed.    History of Present Illness: Lori Gillespie is a 32 y.o. who identifies as a female who was assigned female at birth, and is being seen today for heavy and painful menstrual.   Onset was Provera started in April- having a lot of spotting and cramping.  Associated symptoms are pain and cramps Modifying factors are heating pad, aleve Denies chest pain, shortness of breath, pain in groin, back, n/v, fevers, chills  Problems:  Patient Active Problem List   Diagnosis Date Noted   Carpal tunnel syndrome, left 04/15/2022   Carpal tunnel syndrome on right 07/30/2020   Symptomatic anemia    Abnormal uterine bleeding (AUB) 03/05/2017    Allergies: No Known Allergies Medications:  Current Outpatient Medications:    albuterol (VENTOLIN HFA) 108 (90 Base) MCG/ACT inhaler, TAKE 2 PUFFS BY MOUTH EVERY 6 HOURS AS NEEDED, Disp: 18 each, Rfl: 2   cetirizine (ZYRTEC) 10 MG tablet, TAKE 1 TABLET BY MOUTH ONCE A DAY, Disp: 90 tablet, Rfl: 0   fluticasone (FLONASE) 50 MCG/ACT nasal spray, Place 2 sprays into both nostrils daily., Disp: 16 g, Rfl: 1   megestrol (MEGACE) 40 MG tablet, Take 1 tablet (40 mg total) by mouth 2 (two) times daily., Disp: 60 tablet, Rfl: 0   metFORMIN (GLUCOPHAGE) 500 MG tablet, Take 1 tablet (  500 mg total) by mouth 2 (two) times daily with a meal., Disp: 180 tablet, Rfl: 0   naproxen sodium (ALEVE) 220 MG tablet, Take 440 mg 2 (two) times daily as needed by mouth., Disp: , Rfl:    ondansetron (ZOFRAN-ODT) 4 MG disintegrating tablet, Take 1 tablet (4 mg total) by mouth every 8 (eight) hours as needed for nausea or vomiting., Disp: 20 tablet, Rfl: 0   predniSONE (DELTASONE) 10 MG tablet, 6 tabs po day 1, 5 tabs po day 2, 4 tabs po  day 3, 3 tabs po day 4, 2 tabs po day 5, 1 tab po day 6, Disp: 21 tablet, Rfl: 0  Observations/Objective: Patient is well-developed, well-nourished in no acute distress.  Resting comfortably  at home.  Head is normocephalic, atraumatic.  No labored breathing.  Speech is clear and coherent with logical content.  Patient is alert and oriented at baseline.    Assessment and Plan:  1. Irregular menstruation  -Continue Provera until you talk to PCP and or OB -If aleve doesn't help pain and or bleeding gets worse please be seen in person. -work note provided  Reviewed side effects, risks and benefits of medication.    Patient acknowledged agreement and understanding of the plan.   Past Medical, Surgical, Social History, Allergies, and Medications have been Reviewed.    Follow Up Instructions: I discussed the assessment and treatment plan with the patient. The patient was provided an opportunity to ask questions and all were answered. The patient agreed with the plan and demonstrated an understanding of the instructions.  A copy of instructions were sent to the patient via MyChart unless otherwise noted below.    The patient was advised to call back or seek an in-person evaluation if the symptoms worsen or if the condition fails to improve as anticipated.  Time:  I spent 10 minutes with the patient via telehealth technology discussing the above problems/concerns.    Freddy Finner, NP

## 2022-10-08 NOTE — Patient Instructions (Addendum)
  Azeneth Puga, thank you for joining Freddy Finner, NP for today's virtual visit.  While this provider is not your primary care provider (PCP), if your PCP is located in our provider database this encounter information will be shared with them immediately following your visit.   A [facility] MyChart account gives you access to today's visit and all your visits, tests, and labs performed at [facility] " click here if you don't have a [facility] MyChart account or go to [email or web address]  Consent: (Patient) Lori Gillespie provided verbal consent for this virtual visit at the beginning of the encounter.  Current Medications:  Current Outpatient Medications:    albuterol (VENTOLIN HFA) 108 (90 Base) MCG/ACT inhaler, TAKE 2 PUFFS BY MOUTH EVERY 6 HOURS AS NEEDED, Disp: 18 each, Rfl: 2   cetirizine (ZYRTEC) 10 MG tablet, TAKE 1 TABLET BY MOUTH ONCE A DAY, Disp: 90 tablet, Rfl: 0   fluticasone (FLONASE) 50 MCG/ACT nasal spray, Place 2 sprays into both nostrils daily., Disp: 16 g, Rfl: 1   megestrol (MEGACE) 40 MG tablet, Take 1 tablet (40 mg total) by mouth 2 (two) times daily., Disp: 60 tablet, Rfl: 0   metFORMIN (GLUCOPHAGE) 500 MG tablet, Take 1 tablet (500 mg total) by mouth 2 (two) times daily with a meal., Disp: 180 tablet, Rfl: 0   naproxen sodium (ALEVE) 220 MG tablet, Take 440 mg 2 (two) times daily as needed by mouth., Disp: , Rfl:    ondansetron (ZOFRAN-ODT) 4 MG disintegrating tablet, Take 1 tablet (4 mg total) by mouth every 8 (eight) hours as needed for nausea or vomiting., Disp: 20 tablet, Rfl: 0   predniSONE (DELTASONE) 10 MG tablet, 6 tabs po day 1, 5 tabs po day 2, 4 tabs po day 3, 3 tabs po day 4, 2 tabs po day 5, 1 tab po day 6, Disp: 21 tablet, Rfl: 0   Medications ordered in this encounter:  No orders of the defined types were placed in this encounter.    *If you need refills on other medications prior to your next appointment, please contact  your pharmacy*  Follow-Up: Call back or seek an in-person evaluation if the symptoms worsen or if the condition fails to improve as anticipated.  [facility] [phone]  Other Instructions  -Continue Provera until you talk to PCP and or OB -If aleve doesn't help pain and or bleeding gets worse please be seen in person. -work note provided   Follow up with PCP and or OB about possible need for medication changes since you are having heavy spotting and pain at times with Provera  If you have been instructed to have an in-person evaluation today at a local Urgent Care facility, please use the link below. It will take you to a list of all of our available [facility], including address, phone number and hours of operation. Please do not delay care.  [facility]  If you or a family member do not have a primary care provider, use the link below to schedule a visit and establish care. When you choose a [facility] primary care physician or advanced practice provider, you gain a long-term partner in health. Find a Primary Care Provider  Learn more about [facility]'s in-office and virtual care options: [facility] - Get Care Now

## 2022-10-09 ENCOUNTER — Encounter: Payer: Self-pay | Admitting: Family Medicine

## 2022-10-11 ENCOUNTER — Telehealth: Payer: Self-pay | Admitting: Nurse Practitioner

## 2022-10-11 DIAGNOSIS — N946 Dysmenorrhea, unspecified: Secondary | ICD-10-CM

## 2022-10-11 MED ORDER — NAPROXEN 500 MG PO TABS: 500.0000 mg | ORAL_TABLET | Freq: Two times a day (BID) | ORAL | 0 refills | Status: DC

## 2022-10-11 NOTE — Patient Instructions (Signed)
Darleth Kneebone, thank you for joining Claiborne Rigg, NP for today's virtual visit.  While this provider is not your primary care provider (PCP), if your PCP is located in our provider database this encounter information will be shared with them immediately following your visit.   A [facility] MyChart account gives you access to today's visit and all your visits, tests, and labs performed at [facility] " click here if you don't have a [facility] MyChart account or go to [email or web address]  Consent: (Patient) Lori Gillespie provided verbal consent for this virtual visit at the beginning of the encounter.  Current Medications:  Current Outpatient Medications:    naproxen (NAPROSYN) 500 MG tablet, Take 1 tablet (500 mg total) by mouth 2 (two) times daily with a meal., Disp: 60 tablet, Rfl: 0   albuterol (VENTOLIN HFA) 108 (90 Base) MCG/ACT inhaler, TAKE 2 PUFFS BY MOUTH EVERY 6 HOURS AS NEEDED, Disp: 18 each, Rfl: 2   cetirizine (ZYRTEC) 10 MG tablet, TAKE 1 TABLET BY MOUTH ONCE A DAY, Disp: 90 tablet, Rfl: 0   fluticasone (FLONASE) 50 MCG/ACT nasal spray, Place 2 sprays into both nostrils daily., Disp: 16 g, Rfl: 1   megestrol (MEGACE) 40 MG tablet, Take 1 tablet (40 mg total) by mouth 2 (two) times daily., Disp: 60 tablet, Rfl: 0   metFORMIN (GLUCOPHAGE) 500 MG tablet, Take 1 tablet (500 mg total) by mouth 2 (two) times daily with a meal., Disp: 180 tablet, Rfl: 0   naproxen sodium (ALEVE) 220 MG tablet, Take 440 mg 2 (two) times daily as needed by mouth., Disp: , Rfl:    ondansetron (ZOFRAN-ODT) 4 MG disintegrating tablet, Take 1 tablet (4 mg total) by mouth every 8 (eight) hours as needed for nausea or vomiting., Disp: 20 tablet, Rfl: 0   predniSONE (DELTASONE) 10 MG tablet, 6 tabs po day 1, 5 tabs po day 2, 4 tabs po day 3, 3 tabs po day 4, 2 tabs po day 5, 1 tab po day 6, Disp: 21 tablet, Rfl: 0   Medications ordered in this encounter:  Meds ordered this  encounter  Medications   naproxen (NAPROSYN) 500 MG tablet    Sig: Take 1 tablet (500 mg total) by mouth 2 (two) times daily with a meal.    Dispense:  60 tablet    Refill:  0    Order Specific Question:   Supervising Provider    Answer:   Merrilee Jansky [id number]     *If you need refills on other medications prior to your next appointment, please contact your pharmacy*  Follow-Up: Call back or seek an in-person evaluation if the symptoms worsen or if the condition fails to improve as anticipated.  [facility] [phone]  Other Instructions Instructed patient that we would not be able to give any additional letters for time off and she would need to be seen in person if any additional days from work are needed   If you have been instructed to have an in-person evaluation today at a local Urgent Care facility, please use the link below. It will take you to a list of all of our available [facility], including address, phone number and hours of operation. Please do not delay care.  [facility]  If you or a family member do not have a primary care provider, use the link below to schedule a visit and establish care. When you choose a [facility] primary  care physician or advanced practice provider, you gain a long-term partner in health. Find a Primary Care Provider  Learn more about [facility]'s in-office and virtual care options: [facility]

## 2022-10-11 NOTE — Progress Notes (Signed)
Virtual Visit Consent   Lori Gillespie, you are scheduled for a virtual visit with a [facility] provider today. Just as with appointments in the office, your consent must be obtained to participate. Your consent will be active for this visit and any virtual visit you may have with one of our providers in the next 365 days. If you have a MyChart account, a copy of this consent can be sent to you electronically.  As this is a virtual visit, video technology does not allow for your provider to perform a traditional examination. This may limit your provider's ability to fully assess your condition. If your provider identifies any concerns that need to be evaluated in person or the need to arrange testing (such as labs, EKG, etc.), we will make arrangements to do so. Although advances in technology are sophisticated, we cannot ensure that it will always work on either your end or our end. If the connection with a video visit is poor, the visit may have to be switched to a telephone visit. With either a video or telephone visit, we are not always able to ensure that we have a secure connection.  By engaging in this virtual visit, you consent to the provision of healthcare and authorize for your insurance to be billed (if applicable) for the services provided during this visit. Depending on your insurance coverage, you may receive a charge related to this service.  I need to obtain your verbal consent now. Are you willing to proceed with your visit today? Zonya Seyller has provided verbal consent on 10/11/2022 for a virtual visit (video or telephone). Claiborne Rigg, NP  Date: 10/11/2022 3:21 PM  Virtual Visit via Video Note   I, Claiborne Rigg, connected with  Lori Gillespie  ([id number], Dec 27, 1990) on 10/11/22 at  3:00 PM EDT by a video-enabled telemedicine application and verified that I am speaking with the correct person using two identifiers.  Location: Patient: Virtual Visit Location  Patient: Home Provider: Virtual Visit Location Provider: Home Office   I discussed the limitations of evaluation and management by telemedicine and the availability of in person appointments. The patient expressed understanding and agreed to proceed.    History of Present Illness: Lori Gillespie is a 32 y.o. who identifies as a female who was assigned female at birth, and is being seen today for dysmenorrhea.  [name] has a history of painful heavy periods. States she can not afford surgery to correct this problem. Currently experiencing heavy bleeding, painful bleeding and cramping with clots. Requesting "pain medication".    Problems:  Patient Active Problem List   Diagnosis Date Noted   Carpal tunnel syndrome, left 04/15/2022   Carpal tunnel syndrome on right 07/30/2020   Symptomatic anemia    Abnormal uterine bleeding (AUB) 03/05/2017    Allergies: No Known Allergies Medications:  Current Outpatient Medications:    naproxen (NAPROSYN) 500 MG tablet, Take 1 tablet (500 mg total) by mouth 2 (two) times daily with a meal., Disp: 60 tablet, Rfl: 0   albuterol (VENTOLIN HFA) 108 (90 Base) MCG/ACT inhaler, TAKE 2 PUFFS BY MOUTH EVERY 6 HOURS AS NEEDED, Disp: 18 each, Rfl: 2   cetirizine (ZYRTEC) 10 MG tablet, TAKE 1 TABLET BY MOUTH ONCE A DAY, Disp: 90 tablet, Rfl: 0   fluticasone (FLONASE) 50 MCG/ACT nasal spray, Place 2 sprays into both nostrils daily., Disp: 16 g, Rfl: 1   megestrol (MEGACE) 40 MG tablet, Take 1 tablet (40 mg total) by mouth 2 (  two) times daily., Disp: 60 tablet, Rfl: 0   metFORMIN (GLUCOPHAGE) 500 MG tablet, Take 1 tablet (500 mg total) by mouth 2 (two) times daily with a meal., Disp: 180 tablet, Rfl: 0   naproxen sodium (ALEVE) 220 MG tablet, Take 440 mg 2 (two) times daily as needed by mouth., Disp: , Rfl:    ondansetron (ZOFRAN-ODT) 4 MG disintegrating tablet, Take 1 tablet (4 mg total) by mouth every 8 (eight) hours as needed for nausea or vomiting., Disp:  20 tablet, Rfl: 0   predniSONE (DELTASONE) 10 MG tablet, 6 tabs po day 1, 5 tabs po day 2, 4 tabs po day 3, 3 tabs po day 4, 2 tabs po day 5, 1 tab po day 6, Disp: 21 tablet, Rfl: 0  Observations/Objective: Patient is well-developed, well-nourished in no acute distress.  Resting comfortably at home.  Head is normocephalic, atraumatic.  No labored breathing.  Speech is clear and coherent with logical content.  Patient is alert and oriented at baseline.    Assessment and Plan: 1. Severe dysmenorrhea - naproxen (NAPROSYN) 500 MG tablet; Take 1 tablet (500 mg total) by mouth 2 (two) times daily with a meal.  Dispense: 60 tablet; Refill: 0  Instructed patient that we would not be able to give any additional letters for time off and she would need to be seen in person if any additional days from work are needed  Follow Up Instructions: I discussed the assessment and treatment plan with the patient. The patient was provided an opportunity to ask questions and all were answered. The patient agreed with the plan and demonstrated an understanding of the instructions.  A copy of instructions were sent to the patient via MyChart unless otherwise noted below.    The patient was advised to call back or seek an in-person evaluation if the symptoms worsen or if the condition fails to improve as anticipated.  Time:  I spent 12 minutes with the patient via telehealth technology discussing the above problems/concerns.    Claiborne Rigg, NP

## 2022-10-14 ENCOUNTER — Ambulatory Visit: Payer: Self-pay | Admitting: Medical

## 2022-10-26 ENCOUNTER — Telehealth: Payer: Self-pay | Admitting: Physician Assistant

## 2022-10-26 DIAGNOSIS — R509 Fever, unspecified: Secondary | ICD-10-CM

## 2022-10-26 DIAGNOSIS — M549 Dorsalgia, unspecified: Secondary | ICD-10-CM

## 2022-10-26 NOTE — Progress Notes (Signed)
Because you describe severe back pain with a high fever, I feel your condition warrants further evaluation and I recommend that you be seen in a face to face visit. This is not typical for acute back pain. I would be concerned you may have a kidney infection, or some other severe infection that will require an in person evaluation.    NOTE: There will be NO CHARGE for this eVisit   If you are having a true medical emergency please call 911.      For an urgent face to face visit,  has eight urgent care centers for your convenience:   NEW!! [facility] Health Urgent Care Center at [facility] Get Driving Directions [phone] [address] [place], [address]    [facility] Health Urgent Care Center at [facility] Get Driving Directions [phone] [address] [place], [place] [address]   [facility] Health Urgent Care Center [facility]) Get Driving Directions [phone] [address] [place], [place] [address]  [facility] Health Urgent Care Center [facility]) Get Driving Directions [phone] [address] [place],  [place]  [address]  [facility] Health Urgent Care Center [facility] - at [employer]  [phone] [address] [place],  [place] [address]   [facility] Health Urgent Care at [facility] Get Driving Directions [phone] [address] [place], [place] [address]   [facility] Health Urgent Care at [facility] Get Driving Directions  [phone] [address] [place], [place] [address]   [facility] Health Urgent Care at [facility] Directions [phone] [address] [place], [place] [address]  Your MyChart E-visit questionnaire answers were reviewed by a board certified advanced clinical practitioner to complete your personal care plan based on your specific symptoms.  Thank you for using e-Visits.    I have spent 5  minutes in review of e-visit questionnaire, review and updating patient chart, medical decision making and response to patient.   Margaretann Loveless, PA-C

## 2022-11-01 ENCOUNTER — Telehealth: Payer: Self-pay | Admitting: Family

## 2022-11-01 DIAGNOSIS — M545 Low back pain, unspecified: Secondary | ICD-10-CM

## 2022-11-01 MED ORDER — DICLOFENAC SODIUM 75 MG PO TBEC: 75.0000 mg | DELAYED_RELEASE_TABLET | Freq: Two times a day (BID) | ORAL | 0 refills | Status: DC

## 2022-11-01 MED ORDER — BACLOFEN 10 MG PO TABS: 10.0000 mg | ORAL_TABLET | Freq: Three times a day (TID) | ORAL | 0 refills | Status: AC

## 2022-11-01 NOTE — Progress Notes (Signed)
Virtual Visit Consent   Lori Gillespie, you are scheduled for a virtual visit with a  provider today. Just as with appointments in the office, your consent must be obtained to participate. Your consent will be active for this visit and any virtual visit you may have with one of our providers in the next 365 days. If you have a MyChart account, a copy of this consent can be sent to you electronically.  As this is a virtual visit, video technology does not allow for your provider to perform a traditional examination. This may limit your provider's ability to fully assess your condition. If your provider identifies any concerns that need to be evaluated in person or the need to arrange testing (such as labs, EKG, etc.), we will make arrangements to do so. Although advances in technology are sophisticated, we cannot ensure that it will always work on either your end or our end. If the connection with a video visit is poor, the visit may have to be switched to a telephone visit. With either a video or telephone visit, we are not always able to ensure that we have a secure connection.  By engaging in this virtual visit, you consent to the provision of healthcare and authorize for your insurance to be billed (if applicable) for the services provided during this visit. Depending on your insurance coverage, you may receive a charge related to this service.  I need to obtain your verbal consent now. Are you willing to proceed with your visit today? Lori Gillespie has provided verbal consent on 11/01/2022 for a virtual visit (video or telephone). Jannifer Rodney, FNP  Date: 11/01/2022 2:23 PM  Virtual Visit via Video Note   I, Jannifer Rodney, connected with  Lori Gillespie  ([id number], 04-14-1991) on 11/01/22 at  2:15 PM EDT by a video-enabled telemedicine application and verified that I am speaking with the correct person using two identifiers.  Location: Patient: Virtual Visit Location Patient:  Home Provider: Virtual Visit Location Provider: Home Office   I discussed the limitations of evaluation and management by telemedicine and the availability of in person appointments. The patient expressed understanding and agreed to proceed.    History of Present Illness: Lori Gillespie is a 32 y.o. who identifies as a female who was assigned female at birth, and is being seen today for back pain that started a few days ago.  HPI: Back Pain This is a new problem. The current episode started in the past 7 days. The problem occurs constantly. The problem has been waxing and waning since onset. The pain is present in the lumbar spine. The quality of the pain is described as aching. The pain is at a severity of 9/10. The pain is mild. The symptoms are aggravated by bending and twisting. She has tried NSAIDs for the symptoms. The treatment provided mild relief.    Problems:  Patient Active Problem List   Diagnosis Date Noted   Carpal tunnel syndrome, left 04/15/2022   Carpal tunnel syndrome on right 07/30/2020   Symptomatic anemia    Abnormal uterine bleeding (AUB) 03/05/2017    Allergies: No Known Allergies Medications:  Current Outpatient Medications:    baclofen (LIORESAL) 10 MG tablet, Take 1 tablet (10 mg total) by mouth 3 (three) times daily., Disp: 30 each, Rfl: 0   diclofenac (VOLTAREN) 75 MG EC tablet, Take 1 tablet (75 mg total) by mouth 2 (two) times daily., Disp: 30 tablet, Rfl: 0   albuterol (VENTOLIN HFA) 108 (90  Base) MCG/ACT inhaler, TAKE 2 PUFFS BY MOUTH EVERY 6 HOURS AS NEEDED, Disp: 18 each, Rfl: 2   cetirizine (ZYRTEC) 10 MG tablet, TAKE 1 TABLET BY MOUTH ONCE A DAY, Disp: 90 tablet, Rfl: 0   fluticasone (FLONASE) 50 MCG/ACT nasal spray, Place 2 sprays into both nostrils daily., Disp: 16 g, Rfl: 1   megestrol (MEGACE) 40 MG tablet, Take 1 tablet (40 mg total) by mouth 2 (two) times daily., Disp: 60 tablet, Rfl: 0   metFORMIN (GLUCOPHAGE) 500 MG tablet, Take 1 tablet (500  mg total) by mouth 2 (two) times daily with a meal., Disp: 180 tablet, Rfl: 0   naproxen (NAPROSYN) 500 MG tablet, Take 1 tablet (500 mg total) by mouth 2 (two) times daily with a meal., Disp: 60 tablet, Rfl: 0   ondansetron (ZOFRAN-ODT) 4 MG disintegrating tablet, Take 1 tablet (4 mg total) by mouth every 8 (eight) hours as needed for nausea or vomiting., Disp: 20 tablet, Rfl: 0  Observations/Objective: Patient is well-developed, well-nourished in no acute distress.  Resting comfortably  at home.  Head is normocephalic, atraumatic.  No labored breathing.  Speech is clear and coherent with logical content.  Patient is alert and oriented at baseline.  Pain in lumbar with flexion   Assessment and Plan: 1. Acute midline low back pain without sciatica - diclofenac (VOLTAREN) 75 MG EC tablet; Take 1 tablet (75 mg total) by mouth 2 (two) times daily.  Dispense: 30 tablet; Refill: 0 - baclofen (LIORESAL) 10 MG tablet; Take 1 tablet (10 mg total) by mouth 3 (three) times daily.  Dispense: 30 each; Refill: 0  Rest Ice  ROM exercises  No other NSAID's while taking diclofenac  Sedation precautions while taking baclofen  Follow up if symptoms worsen or do not improve   Follow Up Instructions: I discussed the assessment and treatment plan with the patient. The patient was provided an opportunity to ask questions and all were answered. The patient agreed with the plan and demonstrated an understanding of the instructions.  A copy of instructions were sent to the patient via MyChart unless otherwise noted below.     The patient was advised to call back or seek an in-person evaluation if the symptoms worsen or if the condition fails to improve as anticipated.  Time:  I spent 8 minutes with the patient via telehealth technology discussing the above problems/concerns.    Jannifer Rodney, FNP

## 2022-11-24 IMAGING — DX DG CHEST 2V
2 series · 2 of 2 positions shown · non-contrast
Comparison: March 05, 2017.

CLINICAL DATA: Cough, shortness of breath.  Positive COVID 19.

EXAM:
CHEST - 2 VIEW

[chest pa]
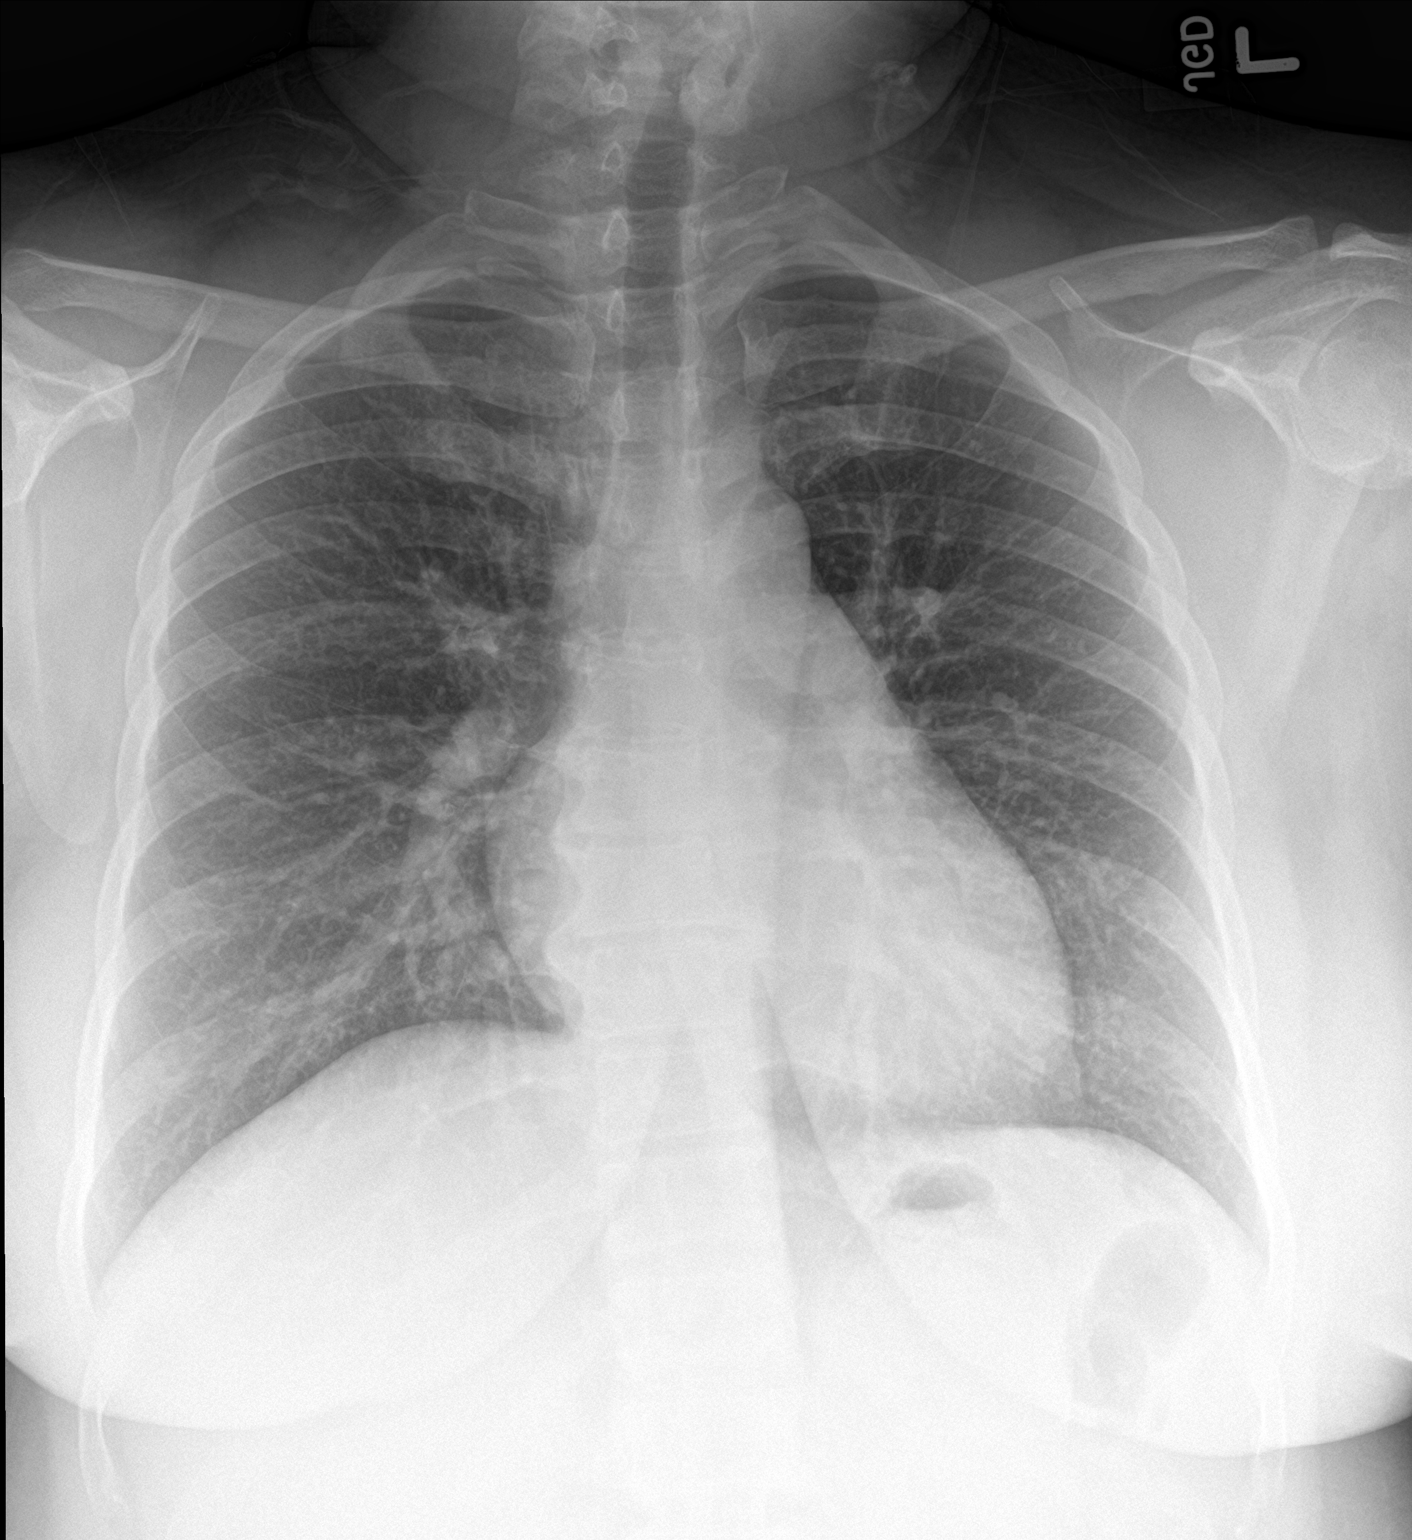

[chest lat]
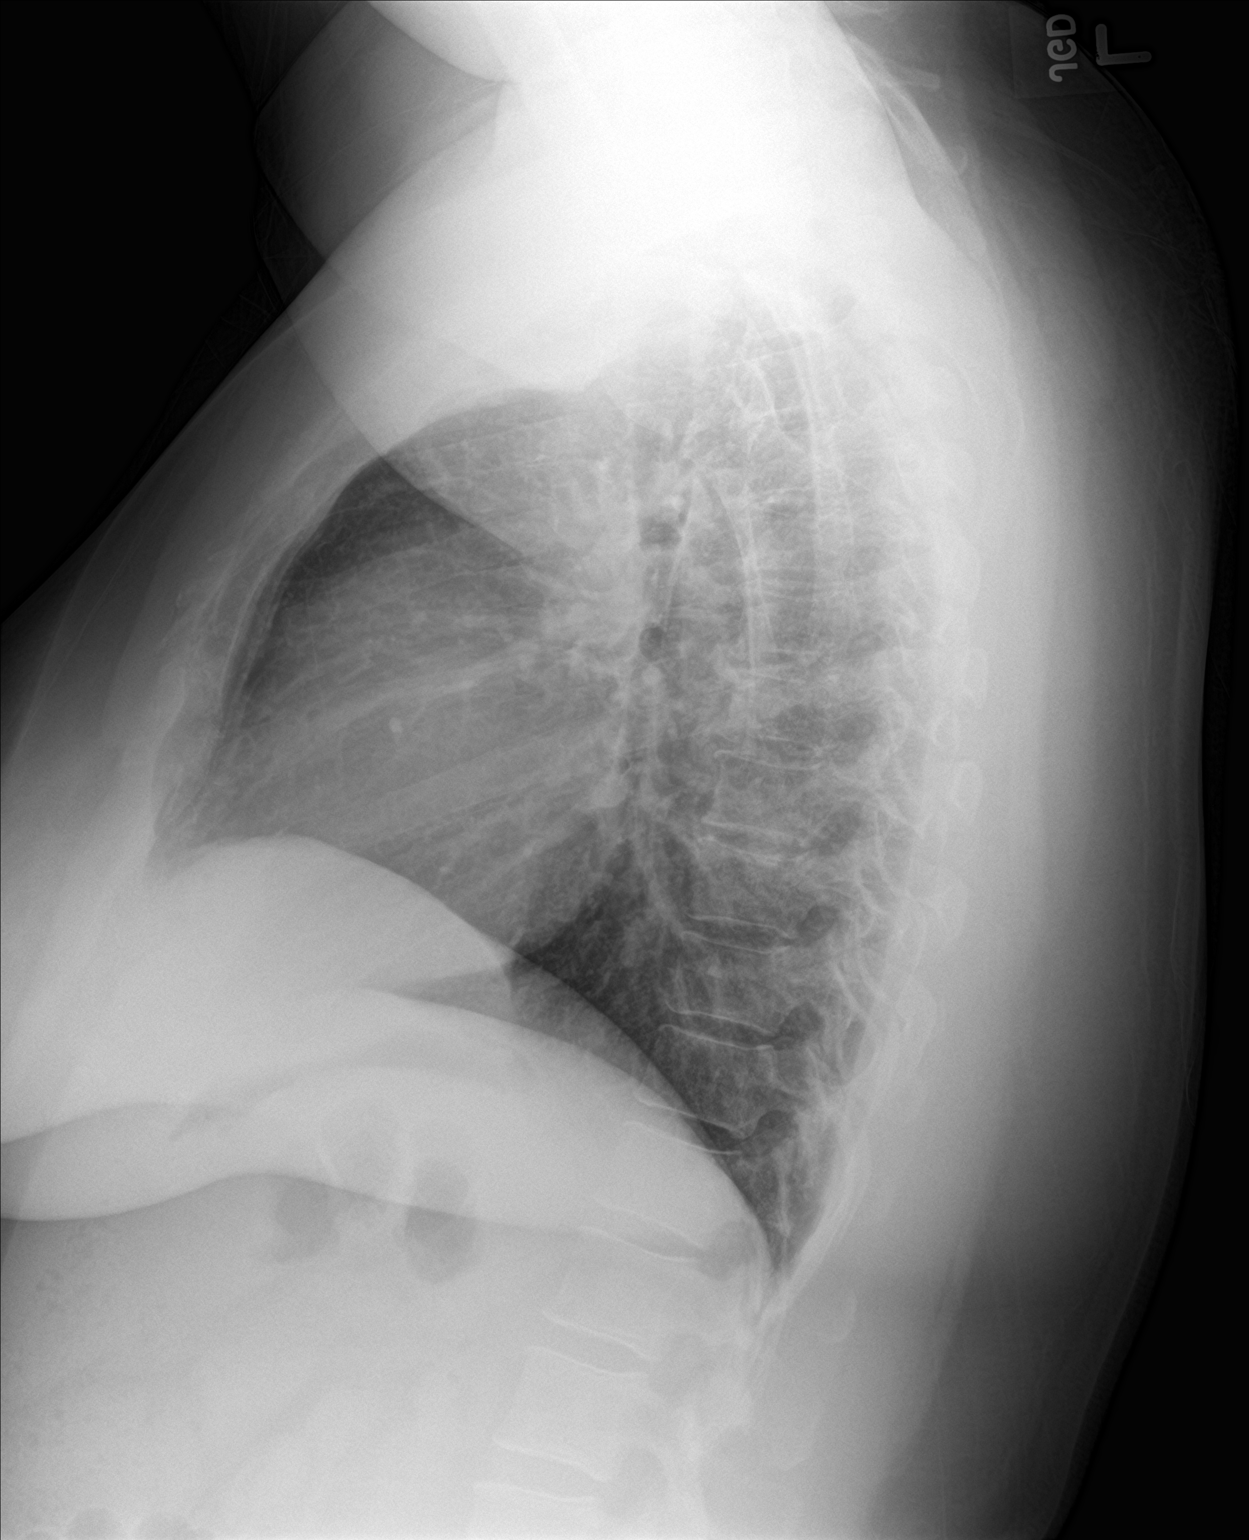

[2 of 2 positions shown; findings below may reference images not displayed]

FINDINGS: The heart size and mediastinal contours are within normal limits.
Both lungs are clear. No pneumothorax or pleural effusion is noted.
The visualized skeletal structures are unremarkable.
IMPRESSION: No active cardiopulmonary disease.

## 2022-12-18 NOTE — Progress Notes (Signed)
This encounter was created in error - please disregard.

## 2023-01-02 ENCOUNTER — Other Ambulatory Visit: Payer: Self-pay | Admitting: Medical

## 2023-01-04 MED ORDER — MEGESTROL ACETATE 40 MG PO TABS: 40.0000 mg | ORAL_TABLET | Freq: Two times a day (BID) | ORAL | 0 refills | Status: DC

## 2023-02-14 ENCOUNTER — Other Ambulatory Visit: Payer: Self-pay | Admitting: Medical

## 2023-02-15 MED ORDER — METFORMIN HCL 500 MG PO TABS: 500.0000 mg | ORAL_TABLET | Freq: Two times a day (BID) | ORAL | 0 refills | Status: DC

## 2023-04-05 ENCOUNTER — Other Ambulatory Visit: Payer: Self-pay | Admitting: Medical

## 2023-04-05 MED ORDER — METFORMIN HCL 500 MG PO TABS: 500.0000 mg | ORAL_TABLET | Freq: Two times a day (BID) | ORAL | 0 refills | Status: DC

## 2023-04-06 NOTE — Telephone Encounter (Signed)
Pt notified via mychart

## 2023-04-08 ENCOUNTER — Encounter: Payer: Self-pay | Admitting: Medical

## 2023-04-08 ENCOUNTER — Ambulatory Visit (INDEPENDENT_AMBULATORY_CARE_PROVIDER_SITE_OTHER): Payer: Self-pay | Admitting: Medical

## 2023-04-08 VITALS — BP 140/85 | HR 84 | Resp 18 | Ht 60.0 in | Wt 239.0 lb

## 2023-04-08 DIAGNOSIS — N946 Dysmenorrhea, unspecified: Secondary | ICD-10-CM

## 2023-04-08 DIAGNOSIS — I10 Essential (primary) hypertension: Secondary | ICD-10-CM

## 2023-04-08 DIAGNOSIS — Z7984 Long term (current) use of oral hypoglycemic drugs: Secondary | ICD-10-CM

## 2023-04-08 DIAGNOSIS — E119 Type 2 diabetes mellitus without complications: Secondary | ICD-10-CM

## 2023-04-08 LAB — COMPREHENSIVE METABOLIC PANEL
ALT: 18 U/L (ref 0–35)
AST: 15 U/L (ref 0–37)
Albumin: 4.4 g/dL (ref 3.5–5.2)
Alkaline Phosphatase: 125 U/L — ABNORMAL HIGH (ref 39–117)
BUN: 8 mg/dL (ref 6–23)
CO2: 26 meq/L (ref 19–32)
Calcium: 9.7 mg/dL (ref 8.4–10.5)
Chloride: 103 meq/L (ref 96–112)
Creatinine, Ser: 0.48 mg/dL (ref 0.40–1.20)
GFR: 124.74 mL/min (ref >60.00)
Glucose, Bld: 126 mg/dL — ABNORMAL HIGH (ref 70–99)
Potassium: 4.8 meq/L (ref 3.5–5.1)
Sodium: 137 meq/L (ref 135–145)
Total Bilirubin: 0.5 mg/dL (ref 0.2–1.2)
Total Protein: 7.5 g/dL (ref 6.0–8.3)

## 2023-04-08 LAB — MICROALBUMIN / CREATININE URINE RATIO
Creatinine,U: 46 mg/dL
Microalb Creat Ratio: 2.5 mg/g (ref 0.0–30.0)
Microalb, Ur: 1.2 mg/dL (ref 0.0–1.9)

## 2023-04-08 LAB — HEMOGLOBIN A1C: Hgb A1c MFr Bld: 7.2 % — ABNORMAL HIGH (ref 4.6–6.5)

## 2023-04-08 MED ORDER — LOSARTAN POTASSIUM 50 MG PO TABS: 50.0000 mg | ORAL_TABLET | Freq: Every day | ORAL | 0 refills | Status: DC

## 2023-04-08 MED ORDER — MEGESTROL ACETATE 40 MG PO TABS: 40.0000 mg | ORAL_TABLET | Freq: Every day | ORAL | 0 refills | Status: DC

## 2023-04-08 NOTE — Patient Instructions (Signed)
Heavy Menstrual Bleeding due to Fibroids Patient has been on Megace for a year with amenorrhea. She has a history of heavy breakthrough bleeding while on OCPs. She has a desire for future pregnancy and needs gynecological management. -Refill Megace 40mg  twice daily for 60 days. -Refer to gynecologist for further management.  Hypertension Patient reported high blood pressure readings at home and blood pressure was elevated during today's visit. Possible effect of Megace on blood pressure. -Start Losartan 50mg  daily for high blood pressure. -Follow up in 10 days to recheck blood pressure.  Diabetes Patient is on Metformin 500mg  twice daily. -Continue Metformin 500mg  twice daily. -Order metabolic panel and A1C to assess kidney function and three-month sugar average.

## 2023-04-08 NOTE — Progress Notes (Signed)
   Subjective:    Patient ID: Lori Gillespie, female    DOB: 1990/06/20, 32 y.o.   MRN: [id number]  HPI Discussed the use of AI scribe software for clinical note transcription with the patient, who gave verbal consent to proceed.  History of Present Illness   The patient, with a history of fibroids diagnosed in 2018, presents with concerns about irregular heavy menstrual cycles. She reports that she was previously on oral contraceptive pills (OCPs) for menstrual cycle regulation, but experienced extremely heavy breakthrough bleeding that required an emergency department visit. She was subsequently started on Megace, which she continues to take, and reports amenorrhea with this medication.  The patient has been on Megace for a year, and recently experienced a shortage of the medication due to a change in her gynecologist. She reports that she has only two days' worth of medication left and expresses concern about potential heavy bleeding if she runs out.  In addition to her gynecological concerns, the patient also reports a recent episode of high blood pressure, with a reading of 168/109 about a month ago. She did not seek emergency care, but instead took a dose of Losartan provided by her sister. She also reports that her blood sugar was high at the time of the blood pressure episode.  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 She recently obtained insurance through her employer, but has not yet received her insurance card. She expresses a desire to conceive in the future and has previously seen a fertility specialist. However, financial difficulties due to the pandemic have delayed further fertility treatment.        Review of Systems See hpi    Objective:   Physical Exam General Mental Status- Alert. General Appearance- Not in acute distress.   Skin General: Color- Normal Color. Moisture- Normal Moisture.  Neck Carotid Arteries-  Normal color. Moisture- Normal Moisture. No carotid bruits. No JVD.  Chest and Lung Exam Auscultation: Breath Sounds:-Normal.  Cardiovascular Auscultation:Rythm- Regular. Murmurs & Other Heart Sounds:Auscultation of the heart reveals- No Murmurs.  Abdomen Inspection:-Inspeection Normal. Palpation/Percussion:Note:No mass. Palpation and Percussion of the abdomen reveal- Non Tender, Non Distended + BS, no rebound or guarding.  Neurologic Cranial Nerve exam:- CN III-XII intact(No nystagmus), symmetric smile. Strength:- 5/5 equal and symmetric strength both upper and lower extremities.        Assessment & Plan:   Assessment and Plan    Heavy Menstrual Bleeding due to Fibroids Patient has been on Megace for a year with amenorrhea. She has a history of heavy breakthrough bleeding while on OCPs. She has a desire for future pregnancy and needs gynecological management. -Refill Megace 40mg  twice daily for 60 days. -Refer to gynecologist for further management.  Hypertension Patient reported high blood pressure readings at home and blood pressure was elevated during today's visit. Possible effect of Megace on blood pressure. -Start Losartan 50mg  daily for high blood pressure. -Follow up in 10 days to recheck blood pressure.  Diabetes Patient is on Metformin 500mg  twice daily. -Continue Metformin 500mg  twice daily. -Order metabolic panel and A1C to assess kidney function and three-month sugar average.

## 2023-04-12 MED ORDER — MEGESTROL ACETATE 40 MG PO TABS: 40.0000 mg | ORAL_TABLET | Freq: Every day | ORAL | 0 refills | Status: DC

## 2023-04-12 MED ORDER — LOSARTAN POTASSIUM 50 MG PO TABS: 50.0000 mg | ORAL_TABLET | Freq: Every day | ORAL | 0 refills | Status: DC

## 2023-04-12 NOTE — Addendum Note (Signed)
Addended by: Maximino Sarin on: 04/12/2023 08:39 AM   Modules accepted: Orders

## 2023-04-16 ENCOUNTER — Encounter: Payer: Self-pay | Admitting: Medical

## 2023-04-16 MED ORDER — METFORMIN HCL 500 MG PO TABS: 500.0000 mg | ORAL_TABLET | Freq: Two times a day (BID) | ORAL | 3 refills | Status: DC

## 2023-04-16 NOTE — Addendum Note (Signed)
Addended by: Gwenevere Abbot on: 04/16/2023 11:58 AM   Modules accepted: Orders

## 2023-05-19 ENCOUNTER — Encounter: Payer: Self-pay | Admitting: Medical

## 2023-05-19 MED ORDER — MEGESTROL ACETATE 40 MG PO TABS: 40.0000 mg | ORAL_TABLET | Freq: Every day | ORAL | 1 refills | Status: DC

## 2023-05-19 NOTE — Addendum Note (Signed)
Addended by: Gwenevere Abbot on: 05/19/2023 07:10 PM   Modules accepted: Orders

## 2023-05-24 ENCOUNTER — Encounter: Payer: Self-pay | Admitting: Medical

## 2023-05-26 ENCOUNTER — Telehealth: Payer: Self-pay | Admitting: *Deleted

## 2023-05-26 NOTE — Telephone Encounter (Signed)
Left patient a message that appointment at [facility] has been cancelled due to scheduling 2 appointments at 2 different offices. The appointment at [facility] was scheduled first on 05/19/2023.

## 2023-05-26 NOTE — Telephone Encounter (Signed)
-----   Message from Neale Burly sent at 05/25/2023 10:18 AM EST ----- Regarding: New Patient Good morning team!   This patient had [facility] the [facility] but when I looked her up she had scheduled appointments at both [facility] and [facility] via MyChart. I'm not sure which the patient intends on going to but she'll need to be contacted. That could free up a space for another new patient.

## 2023-07-05 ENCOUNTER — Encounter: Payer: Self-pay | Admitting: Medical

## 2023-07-08 ENCOUNTER — Ambulatory Visit: Payer: Self-pay | Admitting: Medical

## 2023-07-08 ENCOUNTER — Encounter: Payer: Self-pay | Admitting: Obstetrics and Gynecology

## 2023-07-08 ENCOUNTER — Other Ambulatory Visit: Payer: Self-pay | Admitting: Medical

## 2023-07-17 ENCOUNTER — Encounter (HOSPITAL_BASED_OUTPATIENT_CLINIC_OR_DEPARTMENT_OTHER): Payer: Self-pay

## 2023-07-17 ENCOUNTER — Emergency Department (HOSPITAL_BASED_OUTPATIENT_CLINIC_OR_DEPARTMENT_OTHER)
Admission: EM | Admit: 2023-07-17 | Discharge: 2023-07-17 | Disposition: A | Discharge status: Home / Self Care | Payer: Worker's Compensation | Attending: Emergency Medicine | Admitting: Emergency Medicine

## 2023-07-17 ENCOUNTER — Emergency Department (HOSPITAL_BASED_OUTPATIENT_CLINIC_OR_DEPARTMENT_OTHER): Payer: Worker's Compensation

## 2023-07-17 ENCOUNTER — Other Ambulatory Visit: Payer: Self-pay

## 2023-07-17 DIAGNOSIS — S62524A Nondisplaced fracture of distal phalanx of right thumb, initial encounter for closed fracture: Secondary | ICD-10-CM | POA: Insufficient documentation

## 2023-07-17 DIAGNOSIS — W231XXA Caught, crushed, jammed, or pinched between stationary objects, initial encounter: Secondary | ICD-10-CM | POA: Insufficient documentation

## 2023-07-17 DIAGNOSIS — M79644 Pain in right finger(s): Secondary | ICD-10-CM | POA: Diagnosis present

## 2023-07-17 DIAGNOSIS — Y990 Civilian activity done for income or pay: Secondary | ICD-10-CM | POA: Diagnosis not present

## 2023-07-17 MED ORDER — HYDROCODONE-ACETAMINOPHEN 5-325 MG PO TABS: 1.0000 | ORAL_TABLET | Freq: Four times a day (QID) | ORAL | 0 refills | Status: AC | PRN

## 2023-07-17 NOTE — ED Triage Notes (Addendum)
 Injured both right 1st and 2nd finger while at work with a food cart. C/o of pain and swelling. No obvious deformity.

## 2023-07-17 NOTE — ED Provider Notes (Signed)
 [place] EMERGENCY DEPARTMENT AT [facility] Provider Note   CSN: [id number] Arrival date & time: 07/17/23  2036     History  Chief Complaint  Patient presents with   Finger Injury    Lori Gillespie is a 33 y.o. female.  Patient is a 33 year old female presenting with a right thumb injury.  She was pushing a cart at work when it got pinched between the cart and another object.  She describes pain to the distal phalanx of her thumb.  Pain is worse with movement with no alleviating factors.  The history is provided by the patient.       Home Medications Prior to Admission medications   Medication Sig Start Date End Date Taking? Authorizing Provider  albuterol (VENTOLIN HFA) 108 (90 Base) MCG/ACT inhaler TAKE 2 PUFFS BY MOUTH EVERY 6 HOURS AS NEEDED 01/30/22   Saguier, Ramon Dredge, PA-C  baclofen (LIORESAL) 10 MG tablet Take 1 tablet (10 mg total) by mouth 3 (three) times daily. 11/01/22   Junie Spencer, FNP  cetirizine (ZYRTEC) 10 MG tablet TAKE 1 TABLET BY MOUTH ONCE A DAY 06/19/20   Wallis Bamberg, PA-C  diclofenac (VOLTAREN) 75 MG EC tablet Take 1 tablet (75 mg total) by mouth 2 (two) times daily. 11/01/22   Junie Spencer, FNP  fluticasone (FLONASE) 50 MCG/ACT nasal spray Place 2 sprays into both nostrils daily. 06/17/22   Saguier, Ramon Dredge, PA-C  losartan (COZAAR) 50 MG tablet TAKE 1 TABLET BY MOUTH EVERY DAY 07/08/23   Saguier, Ramon Dredge, PA-C  megestrol (MEGACE) 40 MG tablet Take 1 tablet (40 mg total) by mouth daily. 05/19/23   Saguier, Ramon Dredge, PA-C  metFORMIN (GLUCOPHAGE) 500 MG tablet Take 1 tablet (500 mg total) by mouth 2 (two) times daily with a meal. 04/16/23   Saguier, Ramon Dredge, PA-C  naproxen (NAPROSYN) 500 MG tablet Take 1 tablet (500 mg total) by mouth 2 (two) times daily with a meal. 10/11/22   Claiborne Rigg, NP  ondansetron (ZOFRAN-ODT) 4 MG disintegrating tablet Take 1 tablet (4 mg total) by mouth every 8 (eight) hours as needed for nausea or vomiting. 03/07/22    Allwardt, Crist Infante, PA-C      Allergies    Patient has no known allergies.    Review of Systems   Review of Systems  All other systems reviewed and are negative.   Physical Exam Updated Vital Signs BP (!) 150/102 (BP Location: Right Arm)   Pulse 89   Temp 98.3 F (36.8 C) (Oral)   Resp 18   Ht 5' (1.524 m)   Wt 99.8 kg   LMP  (LMP Unknown)   SpO2 100%   BMI 42.97 kg/m  Physical Exam Vitals and nursing note reviewed.  Constitutional:      Appearance: Normal appearance.  Pulmonary:     Effort: Pulmonary effort is normal.  Musculoskeletal:     Comments: There is swelling and tenderness of the distal phalanx.  No subungual hematoma is noted.  Sensation is intact.  Skin:    General: Skin is warm and dry.  Neurological:     Mental Status: She is alert.     ED Results / Procedures / Treatments   Labs (all labs ordered are listed, but only abnormal results are displayed) Labs Reviewed - No data to display  EKG None  Radiology DG Hand Complete Right Result Date: 07/17/2023 CLINICAL DATA:  Injury to the right first and second finger while at work. Swelling and bruising. EXAM:  RIGHT HAND - COMPLETE 3+ VIEW COMPARISON:  None Available. FINDINGS: Linear lucencies in the distal phalanx of the right first finger suggesting nondisplaced fracture. No articular involvement. Mild soft tissue swelling. Joint spaces are normal. No radiopaque soft tissue foreign bodies. IMPRESSION: Nondisplaced fractures of the distal phalanx of the right first finger. Mild soft tissue swelling. Electronically Signed   By: Burman Nieves M.D.   On: 07/17/2023 21:26    Procedures Procedures    Medications Ordered in ED Medications - No data to display  ED Course/ Medical Decision Making/ A&P  X-rays show a fracture of the distal phalanx of the thumb that is nondisplaced.  She will be placed in an aluminum splint, advised to ice and follow-up as needed.  Final Clinical Impression(s) / ED  Diagnoses Final diagnoses:  None    Rx / DC Orders ED Discharge Orders     None         Geoffery Lyons, MD 07/17/23 2332

## 2023-07-17 NOTE — Discharge Instructions (Signed)
 Wear splint for comfort and support.  Ice for 20 minutes every 2 hours while awake for the next 2 days.  Take ibuprofen 600 mg every 6 hours as needed for pain.  Take hydrocodone as prescribed as needed for pain not relieved with ibuprofen.

## 2023-07-19 ENCOUNTER — Ambulatory Visit: Payer: Self-pay | Admitting: Medical

## 2023-07-19 ENCOUNTER — Encounter: Payer: Self-pay | Admitting: Obstetrics and Gynecology

## 2023-07-19 VITALS — BP 129/87 | HR 93 | Temp 98.8°F | Resp 18 | Ht 60.0 in | Wt 229.8 lb

## 2023-07-19 DIAGNOSIS — S62501A Fracture of unspecified phalanx of right thumb, initial encounter for closed fracture: Secondary | ICD-10-CM

## 2023-07-19 NOTE — Progress Notes (Signed)
 Subjective:    Patient ID: Lori Gillespie, female    DOB: [birth year], 33 y.o.   MRN: [id number]  HPI Lori Gillespie is a 33 year old female who presents with a right thumb fracture due to a work-related injury. She is accompanied by her husband.  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 The injury occurred when a door swung and closed on her right thumb as she was pushing a food cart. She describes the pain as severe, rating it 10 out of 10 at the time of injury.  An x-ray at the emergency room confirmed a nondisplaced fracture of the distal phalanx with a linear lucency. She was provided with a brace, which frequently falls off. The injury is on her dominant hand, significantly impacting her ability to perform tasks. She has been unable to flex her right thumb since the injury, which affects her daily activities and work performance.  She is concerned about the fit of the brace and is unsure if her insurance will cover a replacement. The injury is classified as a worker's compensation case.   Review of Systems  Constitutional:  Negative for chills, fatigue and fever.  Respiratory:  Negative for cough, chest tightness, shortness of breath and wheezing.   Cardiovascular:  Negative for chest pain and palpitations.  Gastrointestinal:  Negative for abdominal pain.  Musculoskeletal:        Rt thumb pain  Skin:  Negative for rash.  Hematological:  Negative for adenopathy. Does not bruise/bleed easily.  Psychiatric/Behavioral:  Negative for behavioral problems and confusion.     Past Medical History:  Diagnosis Date   Abnormal uterine bleeding    Abnormal uterine bleeding    Anemia    Fibroids    PCOS (polycystic ovarian syndrome)      Social History   Socioeconomic History   Marital status: Married    Spouse name: Not on file   Number of children: Not on file   Years of education: Not on file   Highest  education level: GED or equivalent  Occupational History   Not on file  Tobacco Use   Smoking status: Never   Smokeless tobacco: Never  Vaping Use   Vaping status: Never Used  Substance and Sexual Activity   Alcohol use: No   Drug use: No   Sexual activity: Not on file  Other Topics Concern   Not on file  Social History Narrative   Not on file   Social Drivers of Health   Financial Resource Strain: Low Risk  (07/19/2023)   Overall Financial Resource Strain (CARDIA)    Difficulty of Paying Living Expenses: Not hard at all  Food Insecurity: No Food Insecurity (07/19/2023)   Hunger Vital Sign    Worried About Running Out of Food in the Last Year: Never true    Ran Out of Food in the Last Year: Never true  Transportation Needs: No Transportation Needs (07/19/2023)   PRAPARE - Administrator, Civil Service (Medical): No    Lack of Transportation (Non-Medical): No  Physical Activity: Sufficiently Active (07/19/2023)   Exercise Vital Sign    Days of Exercise per Week: 5 days    Minutes of Exercise per Session: 60 min  Stress: No Stress Concern Present (07/19/2023)   [employer] of Occupational Health - Occupational Stress Questionnaire    Feeling of Stress : Not at all  Social Connections: Moderately Isolated (  07/19/2023)   Social Connection and Isolation Panel [NHANES]    Frequency of Communication with Friends and Family: More than three times a week    Frequency of Social Gatherings with Friends and Family: Twice a week    Attends Religious Services: Never    Database administrator or Organizations: No    Attends Engineer, structural: Not on file    Marital Status: Married  Intimate Partner Violence: Unknown (11/20/2021)   Received from [employer], [facility]   HITS    Physically Hurt: Not on file    Insult or Talk Down To: Not on file    Threaten Physical Harm: Not on file    Scream or Curse: Not on file    No past surgical history on  file.  Family History  Problem Relation Age of Onset   Hypertension Mother    Diabetes Mother    Hypertension Father    Diabetes Father     No Known Allergies  Current Outpatient Medications on File Prior to Visit  Medication Sig Dispense Refill   albuterol (VENTOLIN HFA) 108 (90 Base) MCG/ACT inhaler TAKE 2 PUFFS BY MOUTH EVERY 6 HOURS AS NEEDED 18 each 2   baclofen (LIORESAL) 10 MG tablet Take 1 tablet (10 mg total) by mouth 3 (three) times daily. 30 each 0   cetirizine (ZYRTEC) 10 MG tablet TAKE 1 TABLET BY MOUTH ONCE A DAY 90 tablet 0   diclofenac (VOLTAREN) 75 MG EC tablet Take 1 tablet (75 mg total) by mouth 2 (two) times daily. 30 tablet 0   fluticasone (FLONASE) 50 MCG/ACT nasal spray Place 2 sprays into both nostrils daily. 16 g 1   HYDROcodone-acetaminophen (NORCO/VICODIN) 5-325 MG tablet Take 1-2 tablets by mouth every 6 (six) hours as needed. 12 tablet 0   losartan (COZAAR) 50 MG tablet TAKE 1 TABLET BY MOUTH EVERY DAY 90 tablet 0   megestrol (MEGACE) 40 MG tablet Take 1 tablet (40 mg total) by mouth daily. 60 tablet 1   metFORMIN (GLUCOPHAGE) 500 MG tablet Take 1 tablet (500 mg total) by mouth 2 (two) times daily with a meal. 180 tablet 3   naproxen (NAPROSYN) 500 MG tablet Take 1 tablet (500 mg total) by mouth 2 (two) times daily with a meal. 60 tablet 0   ondansetron (ZOFRAN-ODT) 4 MG disintegrating tablet Take 1 tablet (4 mg total) by mouth every 8 (eight) hours as needed for nausea or vomiting. 20 tablet 0   No current facility-administered medications on file prior to visit.    BP 129/87 (BP Location: Left Arm, Patient Position: Sitting, Cuff Size: Large)   Pulse 93   Temp 98.8 F (37.1 C) (Oral)   Resp 18   Ht 5' (1.524 m)   Wt 229 lb 12.8 oz (104.2 kg)   LMP  (LMP Unknown)   SpO2 98%   BMI 44.88 kg/m        Objective:   Physical Exam  General- No acute distress. Pleasant patient. Rt thumb- swollen and not able to flex at all. Stuck in  extension.      Assessment & Plan:   Patient Instructions  Right thumb fracture Nondisplaced fracture of distal phalanx apparent by x-ray/on impression report. Limited range of motion suggests possible flexor tendon injury. - Refer to hand specialist for tendon injury evaluation. - Verify hand specialist consultation approval under worker's compensation. - Submit today's visit bill to employer for reimbursement - Discuss hand specialist approval with  worker's comp. They may not approve or they may refer you to other provider. -defer brace change to worker comp provider or specialist.  Follow up as needed.

## 2023-07-19 NOTE — Patient Instructions (Signed)
 Right thumb fracture Nondisplaced fracture of distal phalanx apparent by x-ray/on impression report. Limited range of motion suggests possible flexor tendon injury. - Refer to hand specialist for tendon injury evaluation. - Verify hand specialist consultation approval under worker's compensation. - Submit today's visit bill to employer for reimbursement - Discuss hand specialist approval with  worker's comp. They may not approve or they may refer you to other provider. -defer brace change to worker comp provider or specialist.  Follow up as needed.

## 2023-08-01 ENCOUNTER — Other Ambulatory Visit: Payer: Self-pay | Admitting: Medical

## 2023-08-15 ENCOUNTER — Other Ambulatory Visit: Payer: Self-pay | Admitting: Medical

## 2023-09-15 ENCOUNTER — Encounter: Payer: Self-pay | Admitting: Medical

## 2023-09-23 ENCOUNTER — Ambulatory Visit: Admitting: Medical

## 2023-09-27 ENCOUNTER — Ambulatory Visit: Admitting: Medical

## 2023-09-30 ENCOUNTER — Ambulatory Visit: Admitting: Medical

## 2023-10-12 DIAGNOSIS — D219 Benign neoplasm of connective and other soft tissue, unspecified: Secondary | ICD-10-CM | POA: Diagnosis not present

## 2023-10-12 DIAGNOSIS — Z124 Encounter for screening for malignant neoplasm of cervix: Secondary | ICD-10-CM | POA: Diagnosis not present

## 2023-10-12 DIAGNOSIS — R030 Elevated blood-pressure reading, without diagnosis of hypertension: Secondary | ICD-10-CM | POA: Diagnosis not present

## 2023-10-12 DIAGNOSIS — D500 Iron deficiency anemia secondary to blood loss (chronic): Secondary | ICD-10-CM | POA: Diagnosis not present

## 2023-10-12 DIAGNOSIS — Z01419 Encounter for gynecological examination (general) (routine) without abnormal findings: Secondary | ICD-10-CM | POA: Diagnosis not present

## 2023-10-12 DIAGNOSIS — Z1151 Encounter for screening for human papillomavirus (HPV): Secondary | ICD-10-CM | POA: Diagnosis not present

## 2023-10-12 DIAGNOSIS — Z6841 Body Mass Index (BMI) 40.0 and over, adult: Secondary | ICD-10-CM | POA: Diagnosis not present

## 2023-10-12 DIAGNOSIS — N921 Excessive and frequent menstruation with irregular cycle: Secondary | ICD-10-CM | POA: Diagnosis not present

## 2023-10-14 ENCOUNTER — Ambulatory Visit: Admitting: Medical

## 2023-10-15 ENCOUNTER — Ambulatory Visit: Admitting: Medical

## 2023-10-15 ENCOUNTER — Encounter

## 2023-10-15 ENCOUNTER — Ambulatory Visit
Admission: RE | Admit: 2023-10-15 | Discharge: 2023-10-15 | Disposition: A | Discharge status: Home / Self Care | Source: Ambulatory Visit | Attending: Nurse Practitioner

## 2023-10-15 ENCOUNTER — Ambulatory Visit: Payer: Self-pay | Admitting: Medical

## 2023-10-15 VITALS — BP 140/80 | HR 84 | Resp 18 | Ht 61.0 in | Wt 235.0 lb

## 2023-10-15 VITALS — BP 139/92 | HR 96 | Temp 98.7°F | Resp 17 | Ht 60.0 in | Wt 235.0 lb

## 2023-10-15 DIAGNOSIS — N898 Other specified noninflammatory disorders of vagina: Secondary | ICD-10-CM | POA: Insufficient documentation

## 2023-10-15 DIAGNOSIS — Z862 Personal history of diseases of the blood and blood-forming organs and certain disorders involving the immune mechanism: Secondary | ICD-10-CM | POA: Diagnosis not present

## 2023-10-15 DIAGNOSIS — E119 Type 2 diabetes mellitus without complications: Secondary | ICD-10-CM | POA: Diagnosis not present

## 2023-10-15 DIAGNOSIS — R81 Glycosuria: Secondary | ICD-10-CM | POA: Diagnosis not present

## 2023-10-15 DIAGNOSIS — Z7984 Long term (current) use of oral hypoglycemic drugs: Secondary | ICD-10-CM | POA: Diagnosis not present

## 2023-10-15 DIAGNOSIS — N946 Dysmenorrhea, unspecified: Secondary | ICD-10-CM | POA: Diagnosis not present

## 2023-10-15 LAB — CBC WITH DIFFERENTIAL/PLATELET
Basophils Absolute: 0.1 10*3/uL (ref 0.0–0.1)
Basophils Relative: 0.8 % (ref 0.0–3.0)
Eosinophils Absolute: 0.3 10*3/uL (ref 0.0–0.7)
Eosinophils Relative: 3.5 % (ref 0.0–5.0)
HCT: 43.6 % (ref 36.0–46.0)
Hemoglobin: 14.4 g/dL (ref 12.0–15.0)
Lymphocytes Relative: 30.1 % (ref 12.0–46.0)
Lymphs Abs: 2.2 10*3/uL (ref 0.7–4.0)
MCHC: 32.9 g/dL (ref 30.0–36.0)
MCV: 86.8 fl (ref 78.0–100.0)
Monocytes Absolute: 0.4 10*3/uL (ref 0.1–1.0)
Monocytes Relative: 5.9 % (ref 3.0–12.0)
Neutro Abs: 4.4 10*3/uL (ref 1.4–7.7)
Neutrophils Relative %: 59.7 % (ref 43.0–77.0)
Platelets: 319 10*3/uL (ref 150.0–400.0)
RBC: 5.02 Mil/uL (ref 3.87–5.11)
RDW: 13.5 % (ref 11.5–15.5)
WBC: 7.4 10*3/uL (ref 4.0–10.5)

## 2023-10-15 LAB — COMPREHENSIVE METABOLIC PANEL WITH GFR
ALT: 19 U/L (ref 0–35)
AST: 14 U/L (ref 0–37)
Albumin: 4.6 g/dL (ref 3.5–5.2)
Alkaline Phosphatase: 111 U/L (ref 39–117)
BUN: 9 mg/dL (ref 6–23)
CO2: 23 meq/L (ref 19–32)
Calcium: 10 mg/dL (ref 8.4–10.5)
Chloride: 103 meq/L (ref 96–112)
Creatinine, Ser: 0.42 mg/dL (ref 0.40–1.20)
GFR: 128.35 mL/min (ref >60.00)
Glucose, Bld: 121 mg/dL — ABNORMAL HIGH (ref 70–99)
Potassium: 4.4 meq/L (ref 3.5–5.1)
Sodium: 136 meq/L (ref 135–145)
Total Bilirubin: 0.4 mg/dL (ref 0.2–1.2)
Total Protein: 7.8 g/dL (ref 6.0–8.3)

## 2023-10-15 LAB — POCT URINALYSIS DIP (MANUAL ENTRY)
Bilirubin, UA: NEGATIVE
Glucose, UA: 500 mg/dL — AB
Ketones, POC UA: NEGATIVE mg/dL
Leukocytes, UA: NEGATIVE
Nitrite, UA: NEGATIVE
Protein Ur, POC: NEGATIVE mg/dL
Spec Grav, UA: 1.025 (ref 1.010–1.025)
Urobilinogen, UA: 0.2 U/dL
pH, UA: 5.5 (ref 5.0–8.0)

## 2023-10-15 LAB — HEMOGLOBIN A1C: Hgb A1c MFr Bld: 7.1 % — ABNORMAL HIGH (ref 4.6–6.5)

## 2023-10-15 LAB — POCT URINE PREGNANCY: Preg Test, Ur: NEGATIVE

## 2023-10-15 MED ORDER — FLUCONAZOLE 150 MG PO TABS
150.0000 mg | ORAL_TABLET | ORAL | 0 refills | Status: AC
Start: 2023-10-15 — End: 2023-10-19

## 2023-10-15 MED ORDER — METFORMIN HCL 500 MG PO TABS
ORAL_TABLET | ORAL | 3 refills | Status: AC
Start: 2023-10-15 — End: ?

## 2023-10-15 NOTE — ED Triage Notes (Signed)
 Pt states that she has some vaginal itching  x1 week Pt denies any other symptoms.

## 2023-10-15 NOTE — ED Provider Notes (Signed)
 [facility]    CSN: [id number] Arrival date & time: 10/15/23  1619      History   Chief Complaint Chief Complaint  Patient presents with   Vaginal Itching    Needs fungal culture test done - Entered by patient    HPI Lori Gillespie is a 33 y.o. female.   Lori Gillespie is a 33 y.o. female with a history of type II DM presents with vaginal itching that has been ongoing for about a week. She reports no discharge or irritation, only itching. The patient mentions that when she attempts to scratch the affected area due to severe itching, it causes burning of the external vagina area when urine runs across but the act of urinating itself is not painful. The patient reports a history of abnormal uterine bleeding, which is currently being managed with megestrol . As a result, she does not experience menstrual periods. She is sexually active with one female partner in the past 3 months and denies any concerns for STDs.   The following portions of the patient's history were reviewed and updated as appropriate: allergies, current medications, past family history, past medical history, past social history, past surgical history, and problem list.    Past Medical History:  Diagnosis Date   Abnormal uterine bleeding    Abnormal uterine bleeding    Anemia    Fibroids    PCOS (polycystic ovarian syndrome)     Patient Active Problem List   Diagnosis Date Noted   Carpal tunnel syndrome, left 04/15/2022   Carpal tunnel syndrome on right 07/30/2020   Symptomatic anemia    Abnormal uterine bleeding (AUB) 03/05/2017    History reviewed. No pertinent surgical history.  OB History   No obstetric history on file.      Home Medications    Prior to Admission medications   Medication Sig Start Date End Date Taking? Authorizing Provider  albuterol  (VENTOLIN  HFA) 108 (90 Base) MCG/ACT inhaler TAKE 2 PUFFS BY MOUTH EVERY 6 HOURS AS NEEDED 01/30/22  Yes Saguier, Gaylin Ke, PA-C   baclofen  (LIORESAL ) 10 MG tablet Take 1 tablet (10 mg total) by mouth 3 (three) times daily. 11/01/22  Yes Hawks, Christy A, FNP  cetirizine  (ZYRTEC ) 10 MG tablet TAKE 1 TABLET BY MOUTH ONCE A DAY 06/19/20  Yes Adolph Hoop, PA-C  diclofenac  (VOLTAREN ) 75 MG EC tablet Take 1 tablet (75 mg total) by mouth 2 (two) times daily. 11/01/22  Yes Hawks, Christy A, FNP  fluconazole (DIFLUCAN) 150 MG tablet Take 1 tablet (150 mg total) by mouth every 3 (three) days for 2 doses. 10/15/23 10/19/23 Yes Maryruth Sol, FNP  fluticasone  (FLONASE ) 50 MCG/ACT nasal spray Place 2 sprays into both nostrils daily. 06/17/22  Yes Saguier, Gaylin Ke, PA-C  HYDROcodone -acetaminophen  (NORCO/VICODIN) 5-325 MG tablet Take 1-2 tablets by mouth every 6 (six) hours as needed. 07/17/23  Yes Delo, Dufm Gibbon, MD  losartan  (COZAAR ) 50 MG tablet TAKE 1 TABLET BY MOUTH EVERY DAY 07/08/23  Yes Saguier, Gaylin Ke, PA-C  megestrol  (MEGACE ) 40 MG tablet TAKE 1 TABLET BY MOUTH EVERY DAY 08/16/23  Yes Saguier, Gaylin Ke, PA-C  metFORMIN  (GLUCOPHAGE ) 500 MG tablet 2 tab po bid 10/15/23  Yes Saguier, Gaylin Ke, PA-C  naproxen  (NAPROSYN ) 500 MG tablet Take 1 tablet (500 mg total) by mouth 2 (two) times daily with a meal. 10/11/22  Yes Collins Dean, NP  ondansetron  (ZOFRAN -ODT) 4 MG disintegrating tablet Take 1 tablet (4 mg total) by mouth every 8 (eight) hours as needed for nausea or  vomiting. 03/07/22  Yes Allwardt, Deleta Felix, PA-C    Family History Family History  Problem Relation Age of Onset   Hypertension Mother    Diabetes Mother    Hypertension Father    Diabetes Father     Social History Social History   Tobacco Use   Smoking status: Never   Smokeless tobacco: Never  Vaping Use   Vaping status: Never Used  Substance Use Topics   Alcohol use: No   Drug use: No     Allergies   Patient has no known allergies.   Review of Systems Review of Systems  Gastrointestinal:  Negative for nausea and vomiting.  Genitourinary:  Negative for  dysuria, menstrual problem and vaginal discharge.  All other systems reviewed and are negative.    Physical Exam Triage Vital Signs ED Triage Vitals  Encounter Vitals Group     BP 10/15/23 1628 (!) 139/92     Girls Systolic BP Percentile --      Girls Diastolic BP Percentile --      Boys Systolic BP Percentile --      Boys Diastolic BP Percentile --      Pulse Rate 10/15/23 1628 96     Resp 10/15/23 1628 17     Temp 10/15/23 1628 98.7 F (37.1 C)     Temp Source 10/15/23 1628 Oral     SpO2 10/15/23 1628 98 %     Weight 10/15/23 1627 235 lb (106.6 kg)     Height 10/15/23 1627 5' (1.524 m)     Head Circumference --      Peak Flow --      Pain Score 10/15/23 1626 0     Pain Loc --      Pain Education --      Exclude from Growth Chart --    No data found.  Updated Vital Signs BP (!) 139/92 (BP Location: Right Arm)   Pulse 96   Temp 98.7 F (37.1 C) (Oral)   Resp 17   Ht 5' (1.524 m)   Wt 235 lb (106.6 kg)   SpO2 98%   BMI 45.90 kg/m   Visual Acuity Right Eye Distance:   Left Eye Distance:   Bilateral Distance:    Right Eye Near:   Left Eye Near:    Bilateral Near:     Physical Exam Vitals reviewed.  Constitutional:      General: She is not in acute distress.    Appearance: Normal appearance. She is well-developed. She is obese. She is not ill-appearing, toxic-appearing or diaphoretic.  HENT:     Head: Normocephalic.     Nose: Nose normal.     Mouth/Throat:     Mouth: Mucous membranes are moist.   Eyes:     Conjunctiva/sclera: Conjunctivae normal.    Cardiovascular:     Rate and Rhythm: Normal rate.  Pulmonary:     Effort: Pulmonary effort is normal.  Abdominal:     Palpations: Abdomen is soft.  Genitourinary:    Comments: Deferred; patient performed self-swab for Aptima testing   Musculoskeletal:        General: Normal range of motion.     Cervical back: Normal range of motion and neck supple.   Skin:    General: Skin is warm and dry.    Neurological:     General: No focal deficit present.     Mental Status: She is alert and oriented to person, place, and time.   Psychiatric:  Mood and Affect: Mood normal.        Behavior: Behavior normal. Behavior is cooperative.      UC Treatments / Results  Labs (all labs ordered are listed, but only abnormal results are displayed) Labs Reviewed  POCT URINALYSIS DIP (MANUAL ENTRY) - Abnormal; Notable for the following components:      Result Value   Glucose, UA =500 (*)    Blood, UA trace-intact (*)    All other components within normal limits  POCT URINE PREGNANCY - Normal  CERVICOVAGINAL ANCILLARY ONLY    EKG   Radiology No results found.  Procedures Procedures (including critical care time)  Medications Ordered in UC Medications - No data to display  Initial Impression / Assessment and Plan / UC Course  I have reviewed the triage vital signs and the nursing notes.  Pertinent labs & imaging results that were available during my care of the patient were reviewed by me and considered in my medical decision making (see chart for details).     Patient presents with a one-week history of vaginal itching without discharge or dysuria. Given the absence of discharge and presence of localized itching, a yeast infection is suspected. Bacterial vaginosis is considered less likely due to the lack of discharge. Patient is currently taking megestrol  for abnormal uterine bleeding, which may contribute to changes in vaginal flora. She is sexually active with one female partner over the past three months and reports no concerns for sexually transmitted infections. A dose of fluconazole was prescribed today, with a second dose to be taken in 3 days. A vaginal swab was obtained to test for gonorrhea, chlamydia, yeast, and bacterial pathogens; treatment will be adjusted if necessary based on results. Urinalysis was negative for signs of urinary tract infection but was positive for  glucose; patient has a history of diabetes and saw her PCP earlier today. Her A1c was 7.1%, and metformin  was adjusted. She was advised to continue her current medications and follow lifestyle and dietary recommendations. Patient was instructed to follow up if symptoms persist or worsen or if additional concerns arise once test results return.  Today's evaluation has revealed no signs of a dangerous process. Discussed diagnosis with patient and/or guardian. Patient and/or guardian aware of their diagnosis, possible red flag symptoms to watch out for and need for close follow up. Patient and/or guardian understands verbal and written discharge instructions. Patient and/or guardian comfortable with plan and disposition.  Patient and/or guardian has a clear mental status at this time, good insight into illness (after discussion and teaching) and has clear judgment to make decisions regarding their care  Documentation was completed with the aid of voice recognition software. Transcription may contain typographical errors. Final Clinical Impressions(s) / UC Diagnoses   Final diagnoses:  Vaginal itching  Glycosuria  Type 2 diabetes mellitus without complication, without long-term current use of insulin (HCC)     Discharge Instructions      You were seen today for vaginal itching. This can be caused by several things, including yeast infections, irritation, or other infections. You were treated with Diflucan, an oral antifungal medication commonly used to treat yeast infections. A vaginal swab was collected, and the results are pending. You will be contacted if any follow-up or additional treatment is needed. In the meantime, avoid using scented soaps, sprays, or douches in the vaginal area, and wear breathable, cotton underwear to reduce irritation. Follow up with your healthcare provider if symptoms worsen or do not improve within  a few days.     ED Prescriptions     Medication Sig Dispense Auth.  Provider   fluconazole (DIFLUCAN) 150 MG tablet Take 1 tablet (150 mg total) by mouth every 3 (three) days for 2 doses. 2 tablet Maryruth Sol, FNP      PDMP not reviewed this encounter.   Maryruth Sol, [place] 10/15/23 [phone]

## 2023-10-15 NOTE — Patient Instructions (Signed)
 Menorrhagia Heavy bleeding with clots post-Megestrol  acetate discontinuation. Anemia risk due to bleeding. Awaiting gynecologist input for alternative medication. - Order CBC and iron panel. - Coordinate with gynecologist for medication options. - Send lab results to gynecologist on Monday.  Type 2 Diabetes Mellitus A1c at 7.2%, above target. Considering Ozempic for weight loss, but potential pregnancy requires safety evaluation with gynecologist. - Order A1c and metabolic panel. - Increase metformin  to two tablets twice daily. - Discuss Ozempic use with gynecologist for pregnancy safety as you are in process of upcoming infertility tx.  Hypertension BP at 140/80 mmHg with variable systolic readings. No home monitoring. Discussed monitoring and pregnancy-safe antihypertensives in light of infertility tx that will start in future. - Advise obtaining a blood pressure cuff for daily monitoring. -update me on bp reading in one week - Discuss labetalol and nifedipine if hypertension persists.  Follow up date to be determined after lab review

## 2023-10-15 NOTE — Discharge Instructions (Addendum)
 You were seen today for vaginal itching. This can be caused by several things, including yeast infections, irritation, or other infections. You were treated with Diflucan, an oral antifungal medication commonly used to treat yeast infections. A vaginal swab was collected, and the results are pending. You will be contacted if any follow-up or additional treatment is needed. In the meantime, avoid using scented soaps, sprays, or douches in the vaginal area, and wear breathable, cotton underwear to reduce irritation. Follow up with your healthcare provider if symptoms worsen or do not improve within a few days.

## 2023-10-15 NOTE — Progress Notes (Signed)
   Subjective:    Patient ID: Lori Gillespie, female    DOB: [birth year], 33 y.o.   MRN: [id number]  HPI Lori Gillespie is a 33 year old female with diabetes who presents for evaluation of heavy menstrual bleeding and a three-month diabetic follow-up.  She has been experiencing heavy menstrual bleeding for the past two to three days after discontinuing Megestrol , which she had been taking to manage her menstrual cycles. She describes passing large clots and is concerned about her hemoglobin levels. The bleeding has caused her to miss work, and she has a history of anemia, notably in 2023 when she required emergency care for significant blood loss.  She is here for her three-month diabetic follow-up. Her last A1c was 7.2% in December, which is higher than desired. She is currently on Metformin , initially at 500 mg once daily, but she increased it to two tablets twice a day. She mentions difficulty losing weight and has inquired about Ozempic for weight management. No current gastrointestinal side effects from Metformin , such as diarrhea. Her blood pressure readings have been variable, with a recent reading of 140/80 mmHg. She does not currently have a blood pressure cuff at home to monitor her blood pressure regularly.    Review of Systems See hpi    Objective:   Physical Exam  General Mental Status- Alert. General Appearance- Not in acute distress.   Skin General: Color- Normal Color. Moisture- Normal Moisture.  Neck No JVD.  Chest and Lung Exam Auscultation: Breath Sounds:-Normal.  Cardiovascular Auscultation:Rythm- Regular. Murmurs & Other Heart Sounds:Auscultation of the heart reveals- No Murmurs.  Abdomen Inspection:-Inspeection Normal. Palpation/Percussion:Note:No mass. Palpation and Percussion of the abdomen reveal- Non Tender, Non Distended + BS, no rebound or guarding.   Neurologic Cranial Nerve exam:- CN III-XII intact(No nystagmus), symmetric smile. Strength:-  5/5 equal and symmetric strength both upper and lower extremities.       Assessment & Plan:   Patient Instructions  Menorrhagia Heavy bleeding with clots post-Megestrol  acetate discontinuation. Anemia risk due to bleeding. Awaiting gynecologist input for alternative medication. - Order CBC and iron panel. - Coordinate with gynecologist for medication options. - Send lab results to gynecologist on Monday.  Type 2 Diabetes Mellitus A1c at 7.2%, above target. Considering Ozempic for weight loss, but potential pregnancy requires safety evaluation with gynecologist. - Order A1c and metabolic panel. - Increase metformin  to two tablets twice daily. - Discuss Ozempic use with gynecologist for pregnancy safety as you are in process of upcoming infertility tx.  Hypertension BP at 140/80 mmHg with variable systolic readings. No home monitoring. Discussed monitoring and pregnancy-safe antihypertensives in light of infertility tx that will start in future. - Advise obtaining a blood pressure cuff for daily monitoring. -update me on bp reading in one week - Discuss labetalol and nifedipine if hypertension persists.  Follow up date to be determined after lab review   [name], PA-C

## 2023-10-16 LAB — IRON,TIBC AND FERRITIN PANEL
%SAT: 30 % (ref 16–45)
Ferritin: 72 ng/mL (ref 16–154)
Iron: 122 ug/dL (ref 40–190)
TIBC: 406 ug/dL (ref 250–450)

## 2023-10-18 LAB — CERVICOVAGINAL ANCILLARY ONLY
Bacterial Vaginitis (gardnerella): POSITIVE — AB
Candida Glabrata: NEGATIVE
Candida Vaginitis: POSITIVE — AB
Chlamydia: NEGATIVE
Comment: NEGATIVE
Comment: NEGATIVE
Comment: NEGATIVE
Comment: NEGATIVE
Comment: NEGATIVE
Comment: NORMAL
Neisseria Gonorrhea: NEGATIVE
Trichomonas: NEGATIVE

## 2023-10-19 ENCOUNTER — Ambulatory Visit (HOSPITAL_COMMUNITY): Payer: Self-pay

## 2023-10-19 ENCOUNTER — Encounter: Payer: Self-pay | Admitting: Medical

## 2023-10-19 MED ORDER — METRONIDAZOLE 500 MG PO TABS: 500.0000 mg | ORAL_TABLET | Freq: Two times a day (BID) | ORAL | 0 refills | Status: AC

## 2023-10-28 ENCOUNTER — Ambulatory Visit: Admitting: Medical

## 2023-11-11 DIAGNOSIS — D219 Benign neoplasm of connective and other soft tissue, unspecified: Secondary | ICD-10-CM | POA: Diagnosis not present

## 2023-11-11 DIAGNOSIS — N921 Excessive and frequent menstruation with irregular cycle: Secondary | ICD-10-CM | POA: Diagnosis not present

## 2023-11-11 DIAGNOSIS — D259 Leiomyoma of uterus, unspecified: Secondary | ICD-10-CM | POA: Diagnosis not present

## 2023-12-17 ENCOUNTER — Encounter: Payer: Self-pay | Admitting: Medical

## 2023-12-17 ENCOUNTER — Telehealth: Admitting: Physician Assistant

## 2023-12-17 DIAGNOSIS — G5601 Carpal tunnel syndrome, right upper limb: Secondary | ICD-10-CM

## 2023-12-17 MED ORDER — MELOXICAM 15 MG PO TABS: 15.0000 mg | ORAL_TABLET | Freq: Every day | ORAL | 0 refills | Status: DC

## 2023-12-17 NOTE — Progress Notes (Signed)
 Virtual Visit Consent   Lori Gillespie, you are scheduled for a virtual visit with a [facility] provider today. Just as with appointments in the office, your consent must be obtained to participate. Your consent will be active for this visit and any virtual visit you may have with one of our providers in the next 365 days. If you have a MyChart account, a copy of this consent can be sent to you electronically.  As this is a virtual visit, video technology does not allow for your provider to perform a traditional examination. This may limit your provider's ability to fully assess your condition. If your provider identifies any concerns that need to be evaluated in person or the need to arrange testing (such as labs, EKG, etc.), we will make arrangements to do so. Although advances in technology are sophisticated, we cannot ensure that it will always work on either your end or our end. If the connection with a video visit is poor, the visit may have to be switched to a telephone visit. With either a video or telephone visit, we are not always able to ensure that we have a secure connection.  By engaging in this virtual visit, you consent to the provision of healthcare and authorize for your insurance to be billed (if applicable) for the services provided during this visit. Depending on your insurance coverage, you may receive a charge related to this service.  I need to obtain your verbal consent now. Are you willing to proceed with your visit today? Lori Gillespie has provided verbal consent on 12/17/2023 for a virtual visit (video or telephone). Lori [name], PA-C  Date: 12/17/2023 12:57 PM   Virtual Visit via Video Note   IDelon CHRISTELLA Gillespie, connected with  Terina Mcelhinny  ([id number], Jan 03, 1991) on 12/17/23 at 12:45 PM EDT by a video-enabled telemedicine application and verified that I am speaking with the correct person using two identifiers.  Location: Patient: Virtual Visit  Location Patient: Home Provider: Virtual Visit Location Provider: Home Office   I discussed the limitations of evaluation and management by telemedicine and the availability of in person appointments. The patient expressed understanding and agreed to proceed.    History of Present Illness: Lori Gillespie is a 33 y.o. who identifies as a female who was assigned female at birth, and is being seen today for right wrist pain.  HPI: Hand Pain  The incident occurred 3 to 5 days ago. The incident occurred at home. The pain is present in the right hand. The quality of the pain is described as aching. The pain does not radiate. The pain is mild. The pain has been Fluctuating since the incident. Associated symptoms include numbness and tingling. Nothing aggravates the symptoms. She has tried acetaminophen  and NSAIDs for the symptoms. The treatment provided no relief.    She does have a h/o closed fracture of the distal phalynx of the right thumb, but this is not the area affected. That has been healed and was released from Orthopedics on 11/05/23.  She is diabetic with a most recent A1c of 7.1 in June 2025.   Problems:  Patient Active Problem List   Diagnosis Date Noted   Carpal tunnel syndrome, left 04/15/2022   Carpal tunnel syndrome on right 07/30/2020   Symptomatic anemia    Abnormal uterine bleeding (AUB) 03/05/2017    Allergies: No Known Allergies Medications:  Current Outpatient Medications:    meloxicam  (MOBIC ) 15 MG tablet, Take 1 tablet (15 mg total) by  mouth daily., Disp: 30 tablet, Rfl: 0   albuterol  (VENTOLIN  HFA) 108 (90 Base) MCG/ACT inhaler, TAKE 2 PUFFS BY MOUTH EVERY 6 HOURS AS NEEDED, Disp: 18 each, Rfl: 2   baclofen  (LIORESAL ) 10 MG tablet, Take 1 tablet (10 mg total) by mouth 3 (three) times daily., Disp: 30 each, Rfl: 0   cetirizine  (ZYRTEC ) 10 MG tablet, TAKE 1 TABLET BY MOUTH ONCE A DAY, Disp: 90 tablet, Rfl: 0   fluticasone  (FLONASE ) 50 MCG/ACT nasal spray, Place 2  sprays into both nostrils daily., Disp: 16 g, Rfl: 1   HYDROcodone -acetaminophen  (NORCO/VICODIN) 5-325 MG tablet, Take 1-2 tablets by mouth every 6 (six) hours as needed., Disp: 12 tablet, Rfl: 0   losartan  (COZAAR ) 50 MG tablet, TAKE 1 TABLET BY MOUTH EVERY DAY, Disp: 90 tablet, Rfl: 0   megestrol  (MEGACE ) 40 MG tablet, TAKE 1 TABLET BY MOUTH EVERY DAY, Disp: 90 tablet, Rfl: 1   metFORMIN  (GLUCOPHAGE ) 500 MG tablet, 2 tab po bid, Disp: 360 tablet, Rfl: 3   ondansetron  (ZOFRAN -ODT) 4 MG disintegrating tablet, Take 1 tablet (4 mg total) by mouth every 8 (eight) hours as needed for nausea or vomiting., Disp: 20 tablet, Rfl: 0  Observations/Objective: Patient is well-developed, well-nourished in no acute distress.  Resting comfortably at home.  Head is normocephalic, atraumatic.  No labored breathing.  Speech is clear and coherent with logical content.  Patient is alert and oriented at baseline.    Assessment and Plan: 1. Carpal tunnel syndrome of right wrist (Primary) - meloxicam  (MOBIC ) 15 MG tablet; Take 1 tablet (15 mg total) by mouth daily.  Dispense: 30 tablet; Refill: 0  - Suspect carpal tunnel as symptoms are worse first thing in the morning and awakening her from sleep. - Will add Meloxicam , avoid steroids at this time due to diabetes - Discussed cock-up wrist splint for sleep - Ice after activity - Seek in person evaluation if worsening  Follow Up Instructions: I discussed the assessment and treatment plan with the patient. The patient was provided an opportunity to ask questions and all were answered. The patient agreed with the plan and demonstrated an understanding of the instructions.  A copy of instructions were sent to the patient via MyChart unless otherwise noted below.    The patient was advised to call back or seek an in-person evaluation if the symptoms worsen or if the condition fails to improve as anticipated.    Lori [name], PA-C

## 2023-12-17 NOTE — Patient Instructions (Signed)
 Lori Gillespie, thank you for joining Delon Lori Dickinson, PA-C for today's virtual visit.  While this provider is not your primary care provider (PCP), if your PCP is located in our provider database this encounter information will be shared with them immediately following your visit.   A [facility] MyChart account gives you access to today's visit and all your visits, tests, and labs performed at [facility]  click here if you don't have a [facility] MyChart account or go to [email or web address]  Consent: (Patient) Lori Gillespie provided verbal consent for this virtual visit at the beginning of the encounter.  Current Medications:  Current Outpatient Medications:    meloxicam  (MOBIC ) 15 MG tablet, Take 1 tablet (15 mg total) by mouth daily., Disp: 30 tablet, Rfl: 0   albuterol  (VENTOLIN  HFA) 108 (90 Base) MCG/ACT inhaler, TAKE 2 PUFFS BY MOUTH EVERY 6 HOURS AS NEEDED, Disp: 18 each, Rfl: 2   baclofen  (LIORESAL ) 10 MG tablet, Take 1 tablet (10 mg total) by mouth 3 (three) times daily., Disp: 30 each, Rfl: 0   cetirizine  (ZYRTEC ) 10 MG tablet, TAKE 1 TABLET BY MOUTH ONCE A DAY, Disp: 90 tablet, Rfl: 0   fluticasone  (FLONASE ) 50 MCG/ACT nasal spray, Place 2 sprays into both nostrils daily., Disp: 16 g, Rfl: 1   HYDROcodone -acetaminophen  (NORCO/VICODIN) 5-325 MG tablet, Take 1-2 tablets by mouth every 6 (six) hours as needed., Disp: 12 tablet, Rfl: 0   losartan  (COZAAR ) 50 MG tablet, TAKE 1 TABLET BY MOUTH EVERY DAY, Disp: 90 tablet, Rfl: 0   megestrol  (MEGACE ) 40 MG tablet, TAKE 1 TABLET BY MOUTH EVERY DAY, Disp: 90 tablet, Rfl: 1   metFORMIN  (GLUCOPHAGE ) 500 MG tablet, 2 tab po bid, Disp: 360 tablet, Rfl: 3   ondansetron  (ZOFRAN -ODT) 4 MG disintegrating tablet, Take 1 tablet (4 mg total) by mouth every 8 (eight) hours as needed for nausea or vomiting., Disp: 20 tablet, Rfl: 0   Medications ordered in this encounter:  Meds ordered this encounter  Medications    meloxicam  (MOBIC ) 15 MG tablet    Sig: Take 1 tablet (15 mg total) by mouth daily.    Dispense:  30 tablet    Refill:  0    Supervising Provider:   LAMPTEY, PHILIP O [[id number]]     *If you need refills on other medications prior to your next appointment, please contact your pharmacy*  Follow-Up: Call back or seek an in-person evaluation if the symptoms worsen or if the condition fails to improve as anticipated.  [facility] [phone]  Other Instructions  Pinched Nerve in the Wrist (Carpal Tunnel Syndrome): What to Know  Pinched nerve in the wrist (carpal tunnel syndrome, or CTS) is a nerve problem that causes pain, numbness, and weakness in the wrist, hand, and fingers. The carpal tunnel is a narrow space that is on the palm side of your wrist. Repeated wrist motions or certain diseases may cause swelling in the tunnel. This swelling can pinch the main nerve in the wrist (the median nerve). What are the causes? CTS may be caused by: Moving your hand and wrist over and over again while doing a task. Hurting the wrist. Arthritis. A pocket of fluid (cyst) or a growth (tumor) in the carpal tunnel. Fluid buildup when you are pregnant. Use of tools that vibrate. In some cases, the cause of CTS is not known. What increases the risk? You're more likely to have CTS if: You have a job that makes you do  these things: Move your hand firmly over and over again. Work with tools that vibrate, such as drills or sanders. You're female. You have diabetes, obesity, thyroid problems, or kidney failure. What are the signs or symptoms? Symptoms of this condition include: A tingling feeling in your fingers. You may feel this pain in the thumb, index finger, or middle finger. Tingling or loss of feeling in your hand. Pain in your entire arm. This pain may get worse when you bend your wrist and elbow for a long time. Pain in your wrist that goes up your arm to your shoulder. Pain  that goes down into your palm or fingers. Weakness in your hands. You may find it hard to grab and hold items. Your symptoms may feel worse during the night. How is this diagnosed? CTS is diagnosed with a medical history and physical exam. Tests and imaging may also be done to: Check the electrical signals sent by your nerves into the muscles. Check how well electrical signals pass through your nerves. Check possible causes of your CTS. These include X-rays, ultrasound, and MRI. How is this treated? CTS may be treated with: Lifestyle changes. You will be asked to stop or change the activity that caused your problem. Physical therapy. This may include: Exercises that stretch and strengthen the muscles and tendons in the wrist and hand. Nerve gliding or flossing exercises. These help keep nerves moving smoothly through the tissues around them. Occupational therapy. You'll learn how to use your hand again. Medicines for pain and swelling. You may have injections in your wrist. A wrist splint or brace. Surgery. Follow these instructions at home: If you have a splint or brace: Wear the splint or brace as told. Take it off only if your provider says you can. Check the skin around it every day. Tell your provider if you see problems. Loosen the splint or brace if your fingers tingle, are numb, or turn cold and blue. Keep the splint or brace clean and dry. If the splint or brace isn't waterproof: Do not let it get wet. Cover it when you take a bath or shower. Use a cover that doesn't let any water in. Managing pain, stiffness, and swelling  Use ice or an ice pack as told. If you have a splint or brace that you can take off, remove it only as told. Place a towel between your skin and the ice. Leave the ice on for 20 minutes, 2-3 times a day. If your skin turns red, take off the ice right away to prevent skin damage. The risk of damage is higher if you can't feel pain, heat, or cold. Move  your fingers often to reduce stiffness and swelling. General instructions Take your medicines only as told. Rest your wrist and hand from activity that may cause pain. If your CTS is caused by things you do at work, talk with your employer about making changes. For example, you may need a wrist pad to use while typing. Exercise as told. Follow instructions on how to do nerve gliding or flossing exercises. These help keep nerves in moving smoothly through the tissues around them. Keep all follow-up visits. This is important. Where to find more information American Academy of Orthopedic Surgeons: orthoinfo.aaos.[employer] of Neurological Disorders and Stroke: BasicFM.no Contact a health care provider if: You have new symptoms. Your pain is not controlled with medicines. Your symptoms get worse. Get help right away if: Your hand or wrist tingles or is numb, and  the symptoms become very bad. This information is not intended to replace advice given to you by your health care provider. Make sure you discuss any questions you have with your health care provider. Document Revised: 02/23/2023 Document Reviewed: 12/11/2022 Elsevier Patient Education  2024 Elsevier Inc.   If you have been instructed to have an in-person evaluation today at a local Urgent Care facility, please use the link below. It will take you to a list of all of our available [facility], including address, phone number and hours of operation. Please do not delay care.  [facility]  If you or a family member do not have a primary care provider, use the link below to schedule a visit and establish care. When you choose a [facility] primary care physician or advanced practice provider, you gain a long-term partner in health. Find a Primary Care Provider  Learn more about [facility] in-office and virtual care options: [facility] - Get Care Now

## 2023-12-29 ENCOUNTER — Other Ambulatory Visit: Payer: Self-pay

## 2023-12-29 ENCOUNTER — Telehealth: Admitting: Medical

## 2023-12-29 ENCOUNTER — Encounter: Payer: Self-pay | Admitting: Medical

## 2023-12-29 DIAGNOSIS — J069 Acute upper respiratory infection, unspecified: Secondary | ICD-10-CM

## 2023-12-29 DIAGNOSIS — R059 Cough, unspecified: Secondary | ICD-10-CM | POA: Diagnosis not present

## 2023-12-29 DIAGNOSIS — R0981 Nasal congestion: Secondary | ICD-10-CM | POA: Diagnosis not present

## 2023-12-29 MED ORDER — FLUTICASONE PROPIONATE 50 MCG/ACT NA SUSP: 2.0000 | Freq: Every day | NASAL | 1 refills | Status: AC

## 2023-12-29 MED ORDER — BENZONATATE 100 MG PO CAPS: 100.0000 mg | ORAL_CAPSULE | Freq: Three times a day (TID) | ORAL | 0 refills | Status: AC | PRN

## 2023-12-29 NOTE — Progress Notes (Signed)
 Virtual Visit via Video Note  I connected with [name] on 12/29/23 at  2:00 PM EDT by a video enabled telemedicine application and verified that I am speaking with the correct person using two identifiers.  Location: Patient: Lori Gillespie Provider: office [place]   I discussed the limitations of evaluation and management by telemedicine and the availability of in person appointments. The patient expressed understanding and agreed to proceed.  History of Present Illness: Discussed the use of AI scribe software for clinical note transcription with the patient, who gave verbal consent to proceed.   History of Present Illness          Lori Gillespie is a 33 year old female who presents with symptoms of a viral illness.  Symptoms began on Saturday night with a sore throat and rhinorrhea, followed by fever, cough, and myalgias on Sunday morning. She felt progressively better with most of symptoms first 2 days,  She has experienced chills, sneezing, and cephalgia, and was mostly bedridden due to the severity of her symptoms. She has not been tested for influenza or COVID-19 but mentions feeling significantly better today compared to previous days.  She has been managing her symptoms with over-the-counter medications, including DayQuil, NyQuil, and Aleve . She also mentions staying hydrated and consuming oranges. She has not taken her Mucinex yet but has it available.  No current dyspnea or wheezing, although she experienced wheezing on the first and second days due to nasal congestion. Her breathing has improved, and she no longer needs to use two pillows to sleep comfortably.  Her sister has also become ill.       Review of Systems (symptoms had but now resolving per pt) Constitutional:  Positive for fever. (Subjective early on) HENT:  Positive for congestion, sneezing and sore throat.   Respiratory:  Positive for cough. Negative for chest tightness, shortness of breath and wheezing.         Wheeze slight on Sunday.  Cardiovascular:  Negative for chest pain and palpitations.  Gastrointestinal:  Negative for abdominal pain.  Genitourinary:  Negative for dysuria, frequency and hematuria.  Musculoskeletal:  Positive for myalgias.  Skin:  Negative for rash.  Neurological:  Negative for dizziness, syncope, weakness and light-headedness.  Hematological:  Negative for adenopathy. Does not bruise/bleed easily.  Psychiatric/Behavioral:  Negative for behavioral problems and sleep disturbance. The patient is not nervous/anxious.         Observations/Objective: General-no acute distress, pleasant, oriented. Lungs- on inspection lungs appear unlabored. Neck- no tracheal deviation or jvd on inspection. Neuro- gross motor function appears intact.   Assessment and Plan: Assessment and Plan Patient Instructions  Acute upper respiratory infection with cough and nasal congestion Symptoms improving, likely viral etiology. No antibiotics due to improvement and lack of severe symptoms. Discussed potential for secondary bacterial infections if symptoms worsen. - Prescribed benzonatate  for cough. - Recommended over-the-counter plain Mucinex to thin mucus. - Advised use of Flonase  for nasal congestion if needed. - Instructed to monitor for worsening symptoms such as sinus pain, ear pain, severe chest congestion, or persistent fever, and to contact if these occur for potential antibiotic prescription. - Provided work note for September 2nd, 3rd, and 4th.      Follow Up Instructions:    I discussed the assessment and treatment plan with the patient. The patient was provided an opportunity to ask questions and all were answered. The patient agreed with the plan and demonstrated an understanding of the instructions.   The patient was  advised to call back or seek an in-person evaluation if the symptoms worsen or if the condition fails to improve as anticipated.   [name],  PA-C   Subjective:    Patient ID: Lori Gillespie, female    DOB: June 12, 1990, 33 y.o.   MRN: [id number]  HPI         Past Medical History:  Diagnosis Date   Abnormal uterine bleeding    Abnormal uterine bleeding    Anemia    Fibroids    PCOS (polycystic ovarian syndrome)      Social History   Socioeconomic History   Marital status: Married    Spouse name: Not on file   Number of children: Not on file   Years of education: Not on file   Highest education level: GED or equivalent  Occupational History   Not on file  Tobacco Use   Smoking status: Never   Smokeless tobacco: Never  Vaping Use   Vaping status: Never Used  Substance and Sexual Activity   Alcohol use: No   Drug use: No   Sexual activity: Not on file  Other Topics Concern   Not on file  Social History Narrative   Not on file   Social Drivers of Health   Financial Resource Strain: Low Risk  (10/15/2023)   Overall Financial Resource Strain (CARDIA)    Difficulty of Paying Living Expenses: Not very hard  Food Insecurity: No Food Insecurity (10/15/2023)   Hunger Vital Sign    Worried About Running Out of Food in the Last Year: Never true    Ran Out of Food in the Last Year: Never true  Transportation Needs: No Transportation Needs (10/15/2023)   PRAPARE - Administrator, Civil Service (Medical): No    Lack of Transportation (Non-Medical): No  Physical Activity: Sufficiently Active (10/15/2023)   Exercise Vital Sign    Days of Exercise per Week: 5 days    Minutes of Exercise per Session: 60 min  Stress: Stress Concern Present (10/15/2023)   [employer] of Occupational Health - Occupational Stress Questionnaire    Feeling of Stress: To some extent  Social Connections: Moderately Integrated (10/15/2023)   Social Connection and Isolation Panel    Frequency of Communication with Friends and Family: More than three times a week    Frequency of Social Gatherings with Friends and Family:  More than three times a week    Attends Religious Services: More than 4 times per year    Active Member of [employer] or Organizations: No    Attends Engineer, structural: Not on file    Marital Status: Married  Recent Concern: Social Connections - Moderately Isolated (07/19/2023)   Social Connection and Isolation Panel    Frequency of Communication with Friends and Family: More than three times a week    Frequency of Social Gatherings with Friends and Family: Twice a week    Attends Religious Services: Never    Database administrator or Organizations: No    Attends Engineer, structural: Not on file    Marital Status: Married  Catering manager Violence: Not At Risk (08/26/2023)   Received from [facility]   HITS    Over the last 12 months how often did your partner physically hurt you?: Never    Over the last 12 months how often did your partner insult you or talk down to you?: Never    Over the last 12 months how often did  your partner threaten you with physical harm?: Never    Over the last 12 months how often did your partner scream or curse at you?: Never    No past surgical history on file.  Family History  Problem Relation Age of Onset   Hypertension Mother    Diabetes Mother    Hypertension Father    Diabetes Father     No Known Allergies  Current Outpatient Medications on File Prior to Visit  Medication Sig Dispense Refill   baclofen  (LIORESAL ) 10 MG tablet Take 1 tablet (10 mg total) by mouth 3 (three) times daily. 30 each 0   cetirizine  (ZYRTEC ) 10 MG tablet TAKE 1 TABLET BY MOUTH ONCE A DAY 90 tablet 0   HYDROcodone -acetaminophen  (NORCO/VICODIN) 5-325 MG tablet Take 1-2 tablets by mouth every 6 (six) hours as needed. 12 tablet 0   losartan  (COZAAR ) 50 MG tablet TAKE 1 TABLET BY MOUTH EVERY DAY 90 tablet 0   megestrol  (MEGACE ) 40 MG tablet TAKE 1 TABLET BY MOUTH EVERY DAY 90 tablet 1   meloxicam  (MOBIC ) 15 MG tablet Take 1 tablet (15 mg total) by mouth  daily. 30 tablet 0   metFORMIN  (GLUCOPHAGE ) 500 MG tablet 2 tab po bid 360 tablet 3   ondansetron  (ZOFRAN -ODT) 4 MG disintegrating tablet Take 1 tablet (4 mg total) by mouth every 8 (eight) hours as needed for nausea or vomiting. 20 tablet 0   No current facility-administered medications on file prior to visit.    There were no vitals taken for this visit.       Objective:   Physical Exam           Assessment & Plan:   Patient Instructions  Acute upper respiratory infection with cough and nasal congestion Symptoms improving, likely viral etiology. No antibiotics due to improvement and lack of severe symptoms. Discussed potential for secondary bacterial infections if symptoms worsen. - Prescribed benzonatate  for cough. - Recommended over-the-counter plain Mucinex to thin mucus. - Advised use of Flonase  for nasal congestion if needed. - Instructed to monitor for worsening symptoms such as sinus pain, ear pain, severe chest congestion, or persistent fever, and to contact if these occur for potential antibiotic prescription. - Provided work note for September 2nd, 3rd, and 4th.   Robben Jagiello, PA-C

## 2023-12-29 NOTE — Patient Instructions (Signed)
 Acute upper respiratory infection with cough and nasal congestion Symptoms improving, likely viral etiology. No antibiotics due to improvement and lack of severe symptoms. Discussed potential for secondary bacterial infections if symptoms worsen. - Prescribed benzonatate  for cough. - Recommended over-the-counter plain Mucinex to thin mucus. - Advised use of Flonase  for nasal congestion if needed. - Instructed to monitor for worsening symptoms such as sinus pain, ear pain, severe chest congestion, or persistent fever, and to contact if these occur for potential antibiotic prescription. - Provided work note for September 2nd, 3rd, and 4th.

## 2024-01-13 ENCOUNTER — Other Ambulatory Visit: Payer: Self-pay

## 2024-01-13 ENCOUNTER — Encounter: Payer: Self-pay | Admitting: Medical

## 2024-01-13 MED ORDER — ALBUTEROL SULFATE HFA 108 (90 BASE) MCG/ACT IN AERS: 2.0000 | INHALATION_SPRAY | Freq: Four times a day (QID) | RESPIRATORY_TRACT | 0 refills | Status: AC | PRN

## 2024-01-13 NOTE — Addendum Note (Signed)
 Addended by: DORINA DALLAS HERO on: 01/13/2024 09:31 PM   Modules accepted: Orders

## 2024-01-22 ENCOUNTER — Encounter: Payer: Self-pay | Admitting: Medical

## 2024-01-24 ENCOUNTER — Other Ambulatory Visit: Payer: Self-pay

## 2024-01-24 DIAGNOSIS — G5601 Carpal tunnel syndrome, right upper limb: Secondary | ICD-10-CM

## 2024-01-24 MED ORDER — MELOXICAM 15 MG PO TABS: 15.0000 mg | ORAL_TABLET | Freq: Every day | ORAL | 0 refills | Status: DC

## 2024-01-31 ENCOUNTER — Ambulatory Visit

## 2024-01-31 ENCOUNTER — Encounter

## 2024-02-27 ENCOUNTER — Other Ambulatory Visit: Payer: Self-pay | Admitting: Medical

## 2024-02-27 DIAGNOSIS — G5601 Carpal tunnel syndrome, right upper limb: Secondary | ICD-10-CM

## 2024-02-28 ENCOUNTER — Encounter: Payer: Self-pay | Admitting: Radiology

## 2024-02-29 ENCOUNTER — Encounter

## 2024-03-02 ENCOUNTER — Telehealth (INDEPENDENT_AMBULATORY_CARE_PROVIDER_SITE_OTHER): Admitting: Medical

## 2024-03-02 DIAGNOSIS — B349 Viral infection, unspecified: Secondary | ICD-10-CM | POA: Diagnosis not present

## 2024-03-02 MED ORDER — BENZONATATE 100 MG PO CAPS: 100.0000 mg | ORAL_CAPSULE | Freq: Three times a day (TID) | ORAL | 0 refills | Status: AC | PRN

## 2024-03-02 MED ORDER — FLUTICASONE PROPIONATE 50 MCG/ACT NA SUSP: 2.0000 | Freq: Every day | NASAL | 1 refills | Status: AC

## 2024-03-02 NOTE — Progress Notes (Signed)
 Virtual Visit via Video Note  I connected with Lori Gillespie on 03/02/24 at  9:20 AM EST by a video enabled telemedicine application and verified that I am speaking with the correct person using two identifiers.  Location: Patient: home [place] Provider: office [place]   I discussed the limitations of evaluation and management by telemedicine and the availability of in person appointments. The patient expressed understanding and agreed to proceed.  History of Present Illness: Discussed the use of AI scribe software for clinical note transcription with the patient, who gave verbal consent to proceed.  History of Present Illness         Lori Gillespie is a 33 year old female who presents with fever, sore throat, and runny nose.  She has been experiencing symptoms for three days, starting with a runny nose and sore throat, followed by the onset of fever on the first night. The next day, she developed body aches and a headache. Currently, she continues to have a sore throat and runny nose. No cough is present.  She has not taken a flu or COVID test due to fatigue and the absence of her sister, who previously assisted her. She has been taking Mucinex, starting with one dose yesterday and another this morning, which has helped alleviate her sore throat and pain. Her sore throat is intermittent, sometimes becoming very sore and dry, but improves with hydration, such as drinking hot tea. She reports a low-grade fever currently, which is less severe than the initial fever on Tuesday night. She feels she is improving overall.  She has an appointment for a flu shot at a nearby pharmacy but is unsure about attending due to her current symptoms.  She missed work on Wednesday and Thursday due to her illness and plans to return on Monday after her birthday and the weekend.    Observations/Objective:  General-no acute distress, pleasant, oriented. Lungs- on inspection lungs appear unlabored. Neck- no  tracheal deviation or jvd on inspection. Neuro- gross motor function appears intact.   Assessment and Plan: Assessment and Plan          Viral syndrome Symptoms suggest viral syndrome, possibly more severe than common cold. Differential includes influenza and COVID-19. Improvement noted, further evaluation needed. - Test for influenza and COVID-19.(ask to give me update on those test otc) - Avoid flu vaccination while symptomatic. - Prescribed Flonase  for congestion. - Prescribed Tessalon  Perles for potential cough. - Return to work if symptoms improved/ nofever and tests are negative. - Provided work excuse note.  Follow up date to be determined. Follow  early next week if lingering signs/symptoms. If not improving visit would need to be in person.  [name], PA-C     Follow Up Instructions:    I discussed the assessment and treatment plan with the patient. The patient was provided an opportunity to ask questions and all were answered. The patient agreed with the plan and demonstrated an understanding of the instructions.   The patient was advised to call back or seek an in-person evaluation if the symptoms worsen or if the condition fails to improve as anticipated.   Kilah Drahos, PA-C

## 2024-03-02 NOTE — Patient Instructions (Signed)
 Viral syndrome Symptoms suggest viral syndrome, possibly more severe than common cold. Differential includes influenza and COVID-19. Improvement noted, further evaluation needed. - Test for influenza and COVID-19.(ask to give me update on those test otc) - Avoid flu vaccination while symptomatic. - Prescribed Flonase  for congestion. - Prescribed Tessalon  Perles for potential cough. - Return to work if symptoms improved/ nofever and tests are negative. - Provided work excuse note.  Follow up date to be determined. Follow  early next week if lingering signs/symptoms. If not improving visit would need to be in person.

## 2024-03-03 ENCOUNTER — Ambulatory Visit: Admitting: Medical

## 2024-03-17 ENCOUNTER — Ambulatory Visit: Admitting: Medical

## 2024-04-15 ENCOUNTER — Telehealth: Admitting: Family Medicine

## 2024-04-15 DIAGNOSIS — K529 Noninfective gastroenteritis and colitis, unspecified: Secondary | ICD-10-CM | POA: Diagnosis not present

## 2024-04-15 MED ORDER — ONDANSETRON HCL 4 MG PO TABS: 4.0000 mg | ORAL_TABLET | Freq: Three times a day (TID) | ORAL | 0 refills | Status: AC | PRN

## 2024-04-15 NOTE — Progress Notes (Signed)
 " Virtual Visit Consent   [name], you are scheduled for a virtual visit with a [facility] provider today. Just as with appointments in the office, your consent must be obtained to participate. Your consent will be active for this visit and any virtual visit you may have with one of our providers in the next 365 days. If you have a MyChart account, a copy of this consent can be sent to you electronically.  As this is a virtual visit, video technology does not allow for your provider to perform a traditional examination. This may limit your provider's ability to fully assess your condition. If your provider identifies any concerns that need to be evaluated in person or the need to arrange testing (such as labs, EKG, etc.), we will make arrangements to do so. Although advances in technology are sophisticated, we cannot ensure that it will always work on either your end or our end. If the connection with a video visit is poor, the visit may have to be switched to a telephone visit. With either a video or telephone visit, we are not always able to ensure that we have a secure connection.  By engaging in this virtual visit, you consent to the provision of healthcare and authorize for your insurance to be billed (if applicable) for the services provided during this visit. Depending on your insurance coverage, you may receive a charge related to this service.  I need to obtain your verbal consent now. Are you willing to proceed with your visit today? Lori Gillespie has provided verbal consent on 04/15/2024 for a virtual visit (video or telephone). Lori Gillespie, [place]  Date: 04/15/2024 2:07 PM   Virtual Visit via Video Note   Lori Gillespie, connected with  [name]  ([id number], 1990-12-08) on 04/15/2024 at  2:00 PM EST by a video-enabled telemedicine application and verified that I am speaking with the correct person using two identifiers.  Location: Patient: Virtual Visit Location  Patient: Home Provider: Virtual Visit Location Provider: Home Office   I discussed the limitations of evaluation and management by telemedicine and the availability of in person appointments. The patient expressed understanding and agreed to proceed.    History of Present Illness: Lori Gillespie is a 33 y.o. who identifies as a female who was assigned female at birth, and is being seen today for c/o stomach flu symptoms.  Pt states symptoms started yesterday afternoon while she was at work.  Pt states she has nausea, vomiting and diarrhea.  Pt states she has had two episodes of vomiting and diarrhea.  Pt states she is able to drink and is trying to stay hydrated with water, Gatorade and soup.  Pt states she had sushi yesterday and thinks she had food poisoning.   HPI: HPI  Problems:  Patient Active Problem List   Diagnosis Date Noted   Carpal tunnel syndrome, left 04/15/2022   Carpal tunnel syndrome on right 07/30/2020   Symptomatic anemia    Abnormal uterine bleeding (AUB) 03/05/2017    Allergies: Allergies[1] Medications: Current Medications[2]  Observations/Objective: Patient is well-developed, well-nourished in no acute distress.  Resting comfortably at home.  Head is normocephalic, atraumatic.  No labored breathing.  Speech is clear and coherent with logical content.  Patient is alert and oriented at baseline.    Assessment and Plan: 1. Gastroenteritis (Primary) - ondansetron  (ZOFRAN ) 4 MG tablet; Take 1 tablet (4 mg total) by mouth every 8 (eight) hours as needed for up to 5 days for  nausea or vomiting.  Dispense: 15 tablet; Refill: 0  -Advised Pt to follow a bland diet and continue to stay hydrated with water and soup  -Pt to follow up in person urgent care if symptoms persist or worsen or if feeling fatigued -Pt verbalized understanding  Follow Up Instructions: I discussed the assessment and treatment plan with the patient. The patient was provided an opportunity to  ask questions and all were answered. The patient agreed with the plan and demonstrated an understanding of the instructions.  A copy of instructions were sent to the patient via MyChart unless otherwise noted below.    The patient was advised to call back or seek an in-person evaluation if the symptoms worsen or if the condition fails to improve as anticipated.    Lori Mater, PA-C    [1] No Known Allergies [2]  Current Outpatient Medications:    ondansetron  (ZOFRAN ) 4 MG tablet, Take 1 tablet (4 mg total) by mouth every 8 (eight) hours as needed for up to 5 days for nausea or vomiting., Disp: 15 tablet, Rfl: 0   albuterol  (VENTOLIN  HFA) 108 (90 Base) MCG/ACT inhaler, Inhale 2 puffs into the lungs every 6 (six) hours as needed., Disp: 18 g, Rfl: 0   baclofen  (LIORESAL ) 10 MG tablet, Take 1 tablet (10 mg total) by mouth 3 (three) times daily., Disp: 30 each, Rfl: 0   benzonatate  (TESSALON ) 100 MG capsule, Take 1 capsule (100 mg total) by mouth 3 (three) times daily as needed for cough., Disp: 30 capsule, Rfl: 0   benzonatate  (TESSALON ) 100 MG capsule, Take 1 capsule (100 mg total) by mouth 3 (three) times daily as needed for cough., Disp: 30 capsule, Rfl: 0   cetirizine  (ZYRTEC ) 10 MG tablet, TAKE 1 TABLET BY MOUTH ONCE A DAY, Disp: 90 tablet, Rfl: 0   fluticasone  (FLONASE ) 50 MCG/ACT nasal spray, Place 2 sprays into both nostrils daily., Disp: 16 g, Rfl: 1   fluticasone  (FLONASE ) 50 MCG/ACT nasal spray, Place 2 sprays into both nostrils daily., Disp: 16 g, Rfl: 1   HYDROcodone -acetaminophen  (NORCO/VICODIN) 5-325 MG tablet, Take 1-2 tablets by mouth every 6 (six) hours as needed., Disp: 12 tablet, Rfl: 0   losartan  (COZAAR ) 50 MG tablet, TAKE 1 TABLET BY MOUTH EVERY DAY, Disp: 90 tablet, Rfl: 0   megestrol  (MEGACE ) 40 MG tablet, TAKE 1 TABLET BY MOUTH EVERY DAY, Disp: 90 tablet, Rfl: 1   meloxicam  (MOBIC ) 15 MG tablet, TAKE 1 TABLET(15 MG) BY MOUTH DAILY, Disp: 30 tablet, Rfl: 0   metFORMIN   (GLUCOPHAGE ) 500 MG tablet, 2 tab po bid, Disp: 360 tablet, Rfl: 3  "

## 2024-04-15 NOTE — Patient Instructions (Signed)
 " Lori Gillespie, thank you for joining [name], PA-C for today's virtual visit.  While this provider is not your primary care provider (PCP), if your PCP is located in our provider database this encounter information will be shared with them immediately following your visit.   A [facility] MyChart account gives you access to today's visit and all your visits, tests, and labs performed at [facility]  click here if you don't have a [facility] MyChart account or go to [email or web address]  Consent: (Patient) Lori Gillespie provided verbal consent for this virtual visit at the beginning of the encounter.  Current Medications:  Current Outpatient Medications:    albuterol  (VENTOLIN  HFA) 108 (90 Base) MCG/ACT inhaler, Inhale 2 puffs into the lungs every 6 (six) hours as needed., Disp: 18 g, Rfl: 0   baclofen  (LIORESAL ) 10 MG tablet, Take 1 tablet (10 mg total) by mouth 3 (three) times daily., Disp: 30 each, Rfl: 0   benzonatate  (TESSALON ) 100 MG capsule, Take 1 capsule (100 mg total) by mouth 3 (three) times daily as needed for cough., Disp: 30 capsule, Rfl: 0   benzonatate  (TESSALON ) 100 MG capsule, Take 1 capsule (100 mg total) by mouth 3 (three) times daily as needed for cough., Disp: 30 capsule, Rfl: 0   cetirizine  (ZYRTEC ) 10 MG tablet, TAKE 1 TABLET BY MOUTH ONCE A DAY, Disp: 90 tablet, Rfl: 0   fluticasone  (FLONASE ) 50 MCG/ACT nasal spray, Place 2 sprays into both nostrils daily., Disp: 16 g, Rfl: 1   fluticasone  (FLONASE ) 50 MCG/ACT nasal spray, Place 2 sprays into both nostrils daily., Disp: 16 g, Rfl: 1   HYDROcodone -acetaminophen  (NORCO/VICODIN) 5-325 MG tablet, Take 1-2 tablets by mouth every 6 (six) hours as needed., Disp: 12 tablet, Rfl: 0   losartan  (COZAAR ) 50 MG tablet, TAKE 1 TABLET BY MOUTH EVERY DAY, Disp: 90 tablet, Rfl: 0   megestrol  (MEGACE ) 40 MG tablet, TAKE 1 TABLET BY MOUTH EVERY DAY, Disp: 90 tablet, Rfl: 1   meloxicam  (MOBIC ) 15 MG tablet,  TAKE 1 TABLET(15 MG) BY MOUTH DAILY, Disp: 30 tablet, Rfl: 0   metFORMIN  (GLUCOPHAGE ) 500 MG tablet, 2 tab po bid, Disp: 360 tablet, Rfl: 3   ondansetron  (ZOFRAN -ODT) 4 MG disintegrating tablet, Take 1 tablet (4 mg total) by mouth every 8 (eight) hours as needed for nausea or vomiting., Disp: 20 tablet, Rfl: 0   Medications ordered in this encounter:  No orders of the defined types were placed in this encounter.    *If you need refills on other medications prior to your next appointment, please contact your pharmacy*  Follow-Up: Call back or seek an in-person evaluation if the symptoms worsen or if the condition fails to improve as anticipated.  [facility] [phone]  Other Instructions Viral Gastroenteritis, Adult  Viral gastroenteritis is also known as the stomach flu. This condition may affect your stomach, your small intestine, and your large intestine. It can cause sudden watery poop (diarrhea), fever, and vomiting. This condition is caused by certain germs (viruses). These germs can be passed from person to person very easily (are contagious). Having watery poop and vomiting can make you feel weak and cause you to not have enough water in your body (get dehydrated). This can make you tired and thirsty, make you have a dry mouth, and make it so you pee (urinate) less often. It is important to replace the fluids that you lose from having watery poop and vomiting. What are the causes? You  can get sick by catching germs from other people. You can also get sick by: Eating food, drinking water, or touching a surface that has the germs on it (is contaminated). Sharing utensils or other personal items with a person who is sick. What increases the risk? Having a weak body defense system (immune system). Living with one or more children who are younger than 2 years. Living in a nursing home. Going on cruise ships. What are the signs or symptoms? Symptoms of this condition  start suddenly. Symptoms may last for a few days or for as long as a week. Common symptoms include: Watery poop. Vomiting. Other symptoms include: Fever. Headache. Feeling tired (fatigue). Pain in the belly (abdomen). Chills. Feeling weak. Feeling like you may vomit (nauseous). Muscle aches. Not feeling hungry. How is this treated? This condition typically goes away on its own. The focus of treatment is to replace the fluids that you lose. This condition may be treated with: An ORS (oral rehydration solution). This is a drink that helps you replace fluids and minerals your body lost. It is sold at pharmacies and stores. Medicines to help with your symptoms. Probiotic supplements to reduce symptoms of watery poop. Fluids given through an IV tube, if needed. Older adults and people with other diseases or a weak body defense system are at higher risk for not having enough water in the body. Follow these instructions at home: Eating and drinking  Take an ORS as told by your doctor. Drink clear fluids in small amounts as you are able. Clear fluids include: Water. Ice chips. Fruit juice that has water added to it (is diluted). Low-calorie sports drinks. Drink enough fluid to keep your pee (urine) pale yellow. Eat small amounts of healthy foods every 3-4 hours as you are able. This may include whole grains, fruits, vegetables, lean meats, and yogurt. Avoid fluids that have a lot of sugar or caffeine in them. This includes energy drinks, sports drinks, and soda. Avoid spicy or fatty foods. Avoid alcohol. General instructions  Wash your hands often. This is very important after you have watery poop or you vomit. If you cannot use soap and water, use hand sanitizer. Make sure that all people in your home wash their hands well and often. Take over-the-counter and prescription medicines only as told by your doctor. Rest at home while you get better. Watch your condition for any  changes. Take a warm bath to help with any burning or pain from having watery poop. Keep all follow-up visits. Contact a doctor if: You cannot keep fluids down. Your symptoms get worse. You have new symptoms. You feel light-headed or dizzy. You have muscle cramps. Get help right away if: You have chest pain. You have trouble breathing, or you are breathing very fast. You have a fast heartbeat. You feel very weak or you faint. You have a very bad headache, a stiff neck, or both. You have a rash. You have very bad pain, cramping, or bloating in your belly. Your skin feels cold and clammy. You feel mixed up (confused). You have pain when you pee. You have signs of not having enough water in the body, such as: Dark pee, hardly any pee, or no pee. Cracked lips. Dry mouth. Sunken eyes. Feeling very sleepy. Feeling weak. You have signs of bleeding, such as: You see blood in your vomit. Your vomit looks like coffee grounds. You have bloody or black poop or poop that looks like tar. These symptoms may be  an emergency. Get help right away. Call 911. Do not wait to see if the symptoms will go away. Do not drive yourself to the hospital. Summary Viral gastroenteritis is also known as the stomach flu. This condition can cause sudden watery poop (diarrhea), fever, and vomiting. These germs can be passed from person to person very easily. Take an ORS (oral rehydration solution) as told by your doctor. This is a drink that is sold at pharmacies and stores. Wash your hands often, especially after having watery poop or vomiting. If you cannot use soap and water, use hand sanitizer. This information is not intended to replace advice given to you by your health care provider. Make sure you discuss any questions you have with your health care provider. Document Revised: 02/10/2021 Document Reviewed: 02/10/2021 Elsevier Patient Education  2024 Elsevier Inc.   If you have been instructed to  have an in-person evaluation today at a local Urgent Care facility, please use the link below. It will take you to a list of all of our available [facility], including address, phone number and hours of operation. Please do not delay care.  [facility]  If you or a family member do not have a primary care provider, use the link below to schedule a visit and establish care. When you choose a [facility] primary care physician or advanced practice provider, you gain a long-term partner in health. Find a Primary Care Provider  Learn more about [facility]'s in-office and virtual care options: [facility] - Get Care Now  "

## 2024-05-08 ENCOUNTER — Encounter

## 2024-05-08 ENCOUNTER — Telehealth: Admitting: Physician Assistant

## 2024-05-08 DIAGNOSIS — G5603 Carpal tunnel syndrome, bilateral upper limbs: Secondary | ICD-10-CM | POA: Diagnosis not present

## 2024-05-08 MED ORDER — MELOXICAM 15 MG PO TABS: 15.0000 mg | ORAL_TABLET | Freq: Every day | ORAL | 0 refills | Status: AC

## 2024-05-08 NOTE — Patient Instructions (Signed)
 " Lori Gillespie, thank you for joining [name], PA-C for today's virtual visit.  While this provider is not your primary care provider (PCP), if your PCP is located in our provider database this encounter information will be shared with them immediately following your visit.   A [facility] MyChart account gives you access to today's visit and all your visits, tests, and labs performed at [facility]  click here if you don't have a [facility] MyChart account or go to [email or web address]  Consent: (Patient) Lori Gillespie provided verbal consent for this virtual visit at the beginning of the encounter.  Current Medications:  Current Outpatient Medications:    meloxicam  (MOBIC ) 15 MG tablet, Take 1 tablet (15 mg total) by mouth daily., Disp: 30 tablet, Rfl: 0   albuterol  (VENTOLIN  HFA) 108 (90 Base) MCG/ACT inhaler, Inhale 2 puffs into the lungs every 6 (six) hours as needed., Disp: 18 g, Rfl: 0   baclofen  (LIORESAL ) 10 MG tablet, Take 1 tablet (10 mg total) by mouth 3 (three) times daily., Disp: 30 each, Rfl: 0   benzonatate  (TESSALON ) 100 MG capsule, Take 1 capsule (100 mg total) by mouth 3 (three) times daily as needed for cough., Disp: 30 capsule, Rfl: 0   benzonatate  (TESSALON ) 100 MG capsule, Take 1 capsule (100 mg total) by mouth 3 (three) times daily as needed for cough., Disp: 30 capsule, Rfl: 0   cetirizine  (ZYRTEC ) 10 MG tablet, TAKE 1 TABLET BY MOUTH ONCE A DAY, Disp: 90 tablet, Rfl: 0   fluticasone  (FLONASE ) 50 MCG/ACT nasal spray, Place 2 sprays into both nostrils daily., Disp: 16 g, Rfl: 1   fluticasone  (FLONASE ) 50 MCG/ACT nasal spray, Place 2 sprays into both nostrils daily., Disp: 16 g, Rfl: 1   HYDROcodone -acetaminophen  (NORCO/VICODIN) 5-325 MG tablet, Take 1-2 tablets by mouth every 6 (six) hours as needed., Disp: 12 tablet, Rfl: 0   losartan  (COZAAR ) 50 MG tablet, TAKE 1 TABLET BY MOUTH EVERY DAY, Disp: 90 tablet, Rfl: 0   megestrol   (MEGACE ) 40 MG tablet, TAKE 1 TABLET BY MOUTH EVERY DAY, Disp: 90 tablet, Rfl: 1   metFORMIN  (GLUCOPHAGE ) 500 MG tablet, 2 tab po bid, Disp: 360 tablet, Rfl: 3   Medications ordered in this encounter:  Meds ordered this encounter  Medications   meloxicam  (MOBIC ) 15 MG tablet    Sig: Take 1 tablet (15 mg total) by mouth daily.    Dispense:  30 tablet    Refill:  0    Supervising Provider:   LAMPTEY, PHILIP O [[id number]]     *If you need refills on other medications prior to your next appointment, please contact your pharmacy*  Follow-Up: Call back or seek an in-person evaluation if the symptoms worsen or if the condition fails to improve as anticipated.  [facility] [phone]  Other Instructions  [email or web address]   If you have been instructed to have an in-person evaluation today at a local Urgent Care facility, please use the link below. It will take you to a list of all of our available [facility], including address, phone number and hours of operation. Please do not delay care.  [facility]  If you or a family member do not have a primary care provider, use the link below to schedule a visit and establish care. When you choose a [facility] primary care physician or advanced practice provider, you gain a long-term partner in health. Find a Primary Care Provider  Learn more about [facility]'s in-office and virtual care options: [facility] - Get Care Now "

## 2024-05-08 NOTE — Progress Notes (Signed)
 " Virtual Visit Consent   Lori Gillespie, you are scheduled for a virtual visit with a [facility] provider today. Just as with appointments in the office, your consent must be obtained to participate. Your consent will be active for this visit and any virtual visit you may have with one of our providers in the next 365 days. If you have a MyChart account, a copy of this consent can be sent to you electronically.  As this is a virtual visit, video technology does not allow for your provider to perform a traditional examination. This may limit your provider's ability to fully assess your condition. If your provider identifies any concerns that need to be evaluated in person or the need to arrange testing (such as labs, EKG, etc.), we will make arrangements to do so. Although advances in technology are sophisticated, we cannot ensure that it will always work on either your end or our end. If the connection with a video visit is poor, the visit may have to be switched to a telephone visit. With either a video or telephone visit, we are not always able to ensure that we have a secure connection.  By engaging in this virtual visit, you consent to the provision of healthcare and authorize for your insurance to be billed (if applicable) for the services provided during this visit. Depending on your insurance coverage, you may receive a charge related to this service.  I need to obtain your verbal consent now. Are you willing to proceed with your visit today? Lori Gillespie has provided verbal consent on 05/08/2024 for a virtual visit (video or telephone). Lori [name], PA-C  Date: 05/08/2024 1:09 PM   Virtual Visit via Video Note   IDelon CHRISTELLA Gillespie, connected with  [name]  ([id number], 1991/03/09) on 05/08/2024 at 12:45 PM EST by a video-enabled telemedicine application and verified that I am speaking with the correct person using two identifiers.  Location: Patient: Virtual Visit  Location Patient: Home Provider: Virtual Visit Location Provider: Home Office   I discussed the limitations of evaluation and management by telemedicine and the availability of in person appointments. The patient expressed understanding and agreed to proceed.    History of Present Illness: Lori Gillespie is a 34 y.o. who identifies as a female who was assigned female at birth, and is being seen today for wrist pain.  HPI: Wrist Pain  The pain is present in the right wrist and left wrist. This is a new problem. The current episode started yesterday. There has been a history of trauma. The problem occurs constantly. The problem has been waxing and waning. The quality of the pain is described as aching and dull. The pain is moderate. Associated symptoms include an inability to bear weight, joint swelling, numbness, stiffness and tingling. Pertinent negatives include no fever, itching, joint locking or limited range of motion. The symptoms are aggravated by activity. She has tried NSAIDS, heat, cold and rest (steroid injections into wrist) for the symptoms. The treatment provided moderate relief.    Has history of Carpal tunnel bilaterally and used to get steroid injections in them about once every 6 months. Her previous sports medicine doctor moved away and she has not re-established since.   Since last August, 2025, she has been using Meloxicam  successfully and only intermittently when flared. She is requesting a refill of Meloxicam .   Problems:  Patient Active Problem List   Diagnosis Date Noted   Carpal tunnel syndrome, left 04/15/2022  Carpal tunnel syndrome on right 07/30/2020   Symptomatic anemia    Abnormal uterine bleeding (AUB) 03/05/2017    Allergies: Allergies[1] Medications: Current Medications[2]  Observations/Objective: Patient is well-developed, well-nourished in no acute distress.  Resting comfortably at home.  Head is normocephalic, atraumatic.  No labored breathing.   Speech is clear and coherent with logical content.  Patient is alert and oriented at baseline.    Assessment and Plan: 1. Carpal tunnel syndrome on both sides (Primary) - meloxicam  (MOBIC ) 15 MG tablet; Take 1 tablet (15 mg total) by mouth daily.  Dispense: 30 tablet; Refill: 0  - Meloxicam  refilled - Discussed bracing and exercises - Sent information about Sports Medicine near her in [place] - Work note provided - Seek in person evaluation if worsening  Follow Up Instructions: I discussed the assessment and treatment plan with the patient. The patient was provided an opportunity to ask questions and all were answered. The patient agreed with the plan and demonstrated an understanding of the instructions.  A copy of instructions were sent to the patient via MyChart unless otherwise noted below.    The patient was advised to call back or seek an in-person evaluation if the symptoms worsen or if the condition fails to improve as anticipated.    Lori HERO Kairee Kozma, PA-C     [1] No Known Allergies [2]  Current Outpatient Medications:    meloxicam  (MOBIC ) 15 MG tablet, Take 1 tablet (15 mg total) by mouth daily., Disp: 30 tablet, Rfl: 0   albuterol  (VENTOLIN  HFA) 108 (90 Base) MCG/ACT inhaler, Inhale 2 puffs into the lungs every 6 (six) hours as needed., Disp: 18 g, Rfl: 0   baclofen  (LIORESAL ) 10 MG tablet, Take 1 tablet (10 mg total) by mouth 3 (three) times daily., Disp: 30 each, Rfl: 0   benzonatate  (TESSALON ) 100 MG capsule, Take 1 capsule (100 mg total) by mouth 3 (three) times daily as needed for cough., Disp: 30 capsule, Rfl: 0   benzonatate  (TESSALON ) 100 MG capsule, Take 1 capsule (100 mg total) by mouth 3 (three) times daily as needed for cough., Disp: 30 capsule, Rfl: 0   cetirizine  (ZYRTEC ) 10 MG tablet, TAKE 1 TABLET BY MOUTH ONCE A DAY, Disp: 90 tablet, Rfl: 0   fluticasone  (FLONASE ) 50 MCG/ACT nasal spray, Place 2 sprays into both nostrils daily., Disp: 16 g, Rfl:  1   fluticasone  (FLONASE ) 50 MCG/ACT nasal spray, Place 2 sprays into both nostrils daily., Disp: 16 g, Rfl: 1   HYDROcodone -acetaminophen  (NORCO/VICODIN) 5-325 MG tablet, Take 1-2 tablets by mouth every 6 (six) hours as needed., Disp: 12 tablet, Rfl: 0   losartan  (COZAAR ) 50 MG tablet, TAKE 1 TABLET BY MOUTH EVERY DAY, Disp: 90 tablet, Rfl: 0   megestrol  (MEGACE ) 40 MG tablet, TAKE 1 TABLET BY MOUTH EVERY DAY, Disp: 90 tablet, Rfl: 1   metFORMIN  (GLUCOPHAGE ) 500 MG tablet, 2 tab po bid, Disp: 360 tablet, Rfl: 3  "

## 2024-05-18 ENCOUNTER — Other Ambulatory Visit: Payer: Self-pay

## 2024-05-18 ENCOUNTER — Encounter: Payer: Self-pay | Admitting: Medical

## 2024-05-18 MED ORDER — CETIRIZINE HCL 10 MG PO TABS: ORAL_TABLET | Freq: Every day | ORAL | 0 refills | Status: AC
